# Patient Record
Sex: Female | Born: 1955 | Race: White | Hispanic: No | Marital: Married | Smoking: Former smoker
Health system: Southern US, Community
[De-identification: ages and names within clinical notes are randomized; demographics above are authoritative.]

## PROBLEM LIST (undated history)

## (undated) DIAGNOSIS — R002 Palpitations: Secondary | ICD-10-CM

## (undated) DIAGNOSIS — C801 Malignant (primary) neoplasm, unspecified: Secondary | ICD-10-CM

## (undated) DIAGNOSIS — Z720 Tobacco use: Secondary | ICD-10-CM

## (undated) HISTORY — DX: Tobacco use: Z72.0

## (undated) HISTORY — PX: TONSILLECTOMY: SUR1361

## (undated) HISTORY — DX: Palpitations: R00.2

## (undated) HISTORY — PX: OTHER SURGICAL HISTORY: SHX169

---

## 1984-05-06 HISTORY — PX: NEPHRECTOMY: SHX65

## 1989-01-04 HISTORY — PX: OOPHORECTOMY: SHX86

## 1998-12-11 ENCOUNTER — Ambulatory Visit (HOSPITAL_COMMUNITY): Admission: RE | Admit: 1998-12-11 | Discharge: 1998-12-11 | Payer: Self-pay | Admitting: Gynecology

## 1998-12-11 ENCOUNTER — Encounter: Payer: Self-pay | Admitting: Gynecology

## 1999-03-21 ENCOUNTER — Other Ambulatory Visit: Admission: RE | Admit: 1999-03-21 | Discharge: 1999-03-21 | Payer: Self-pay | Admitting: Gynecology

## 2000-05-13 ENCOUNTER — Other Ambulatory Visit: Admission: RE | Admit: 2000-05-13 | Discharge: 2000-05-13 | Payer: Self-pay | Admitting: Gynecology

## 2000-06-12 ENCOUNTER — Ambulatory Visit (HOSPITAL_COMMUNITY): Admission: RE | Admit: 2000-06-12 | Discharge: 2000-06-12 | Payer: Self-pay | Admitting: Gynecology

## 2000-06-12 ENCOUNTER — Encounter: Payer: Self-pay | Admitting: Gynecology

## 2001-07-04 ENCOUNTER — Encounter: Payer: Self-pay | Admitting: Family Medicine

## 2001-07-04 LAB — CONVERTED CEMR LAB

## 2001-08-03 ENCOUNTER — Other Ambulatory Visit: Admission: RE | Admit: 2001-08-03 | Discharge: 2001-08-03 | Payer: Self-pay | Admitting: Gynecology

## 2007-02-12 ENCOUNTER — Ambulatory Visit: Payer: Self-pay | Admitting: Family Medicine

## 2007-02-12 DIAGNOSIS — L2089 Other atopic dermatitis: Secondary | ICD-10-CM

## 2007-02-12 DIAGNOSIS — J309 Allergic rhinitis, unspecified: Secondary | ICD-10-CM | POA: Insufficient documentation

## 2007-03-31 ENCOUNTER — Ambulatory Visit: Payer: Self-pay | Admitting: Family Medicine

## 2007-03-31 DIAGNOSIS — H9209 Otalgia, unspecified ear: Secondary | ICD-10-CM | POA: Insufficient documentation

## 2007-03-31 DIAGNOSIS — B009 Herpesviral infection, unspecified: Secondary | ICD-10-CM | POA: Insufficient documentation

## 2007-07-13 ENCOUNTER — Telehealth: Payer: Self-pay | Admitting: Family Medicine

## 2007-10-06 ENCOUNTER — Ambulatory Visit: Payer: Self-pay | Admitting: Family Medicine

## 2007-10-06 DIAGNOSIS — Z87891 Personal history of nicotine dependence: Secondary | ICD-10-CM

## 2007-10-06 DIAGNOSIS — J019 Acute sinusitis, unspecified: Secondary | ICD-10-CM

## 2007-10-07 ENCOUNTER — Encounter: Payer: Self-pay | Admitting: Family Medicine

## 2007-10-20 ENCOUNTER — Ambulatory Visit: Payer: Self-pay | Admitting: Family Medicine

## 2007-10-20 DIAGNOSIS — N951 Menopausal and female climacteric states: Secondary | ICD-10-CM

## 2007-10-22 LAB — CONVERTED CEMR LAB
ALT: 22 units/L (ref 0–35)
AST: 22 units/L (ref 0–37)
Albumin: 4.4 g/dL (ref 3.5–5.2)
Alkaline Phosphatase: 32 units/L — ABNORMAL LOW (ref 39–117)
BUN: 15 mg/dL (ref 6–23)
Basophils Absolute: 0.1 10*3/uL (ref 0.0–0.1)
Basophils Relative: 1 % (ref 0.0–1.0)
Bilirubin, Direct: 0.1 mg/dL (ref 0.0–0.3)
CO2: 30 meq/L (ref 19–32)
Calcium: 9.6 mg/dL (ref 8.4–10.5)
Chloride: 105 meq/L (ref 96–112)
Cholesterol: 193 mg/dL (ref 0–200)
Creatinine, Ser: 1 mg/dL (ref 0.4–1.2)
Eosinophils Absolute: 0.1 10*3/uL (ref 0.0–0.7)
Eosinophils Relative: 1.8 % (ref 0.0–5.0)
GFR calc Af Amer: 74 mL/min
GFR calc non Af Amer: 61 mL/min
Glucose, Bld: 75 mg/dL (ref 70–99)
HCT: 38.3 % (ref 36.0–46.0)
HDL: 46.3 mg/dL (ref >39.0)
Hemoglobin: 13.4 g/dL (ref 12.0–15.0)
LDL Cholesterol: 128 mg/dL — ABNORMAL HIGH (ref 0–99)
Lymphocytes Relative: 34.1 % (ref 12.0–46.0)
MCHC: 34.9 g/dL (ref 30.0–36.0)
MCV: 91.3 fL (ref 78.0–100.0)
Monocytes Absolute: 0.2 10*3/uL (ref 0.1–1.0)
Monocytes Relative: 3.9 % (ref 3.0–12.0)
Neutro Abs: 3.2 10*3/uL (ref 1.4–7.7)
Neutrophils Relative %: 59.2 % (ref 43.0–77.0)
Platelets: 237 10*3/uL (ref 150–400)
Potassium: 4.2 meq/L (ref 3.5–5.1)
RBC: 4.19 M/uL (ref 3.87–5.11)
RDW: 11.5 % (ref 11.5–14.6)
Sodium: 141 meq/L (ref 135–145)
TSH: 1.41 microintl units/mL (ref 0.35–5.50)
Total Bilirubin: 0.7 mg/dL (ref 0.3–1.2)
Total CHOL/HDL Ratio: 4.2
Total Protein: 7.2 g/dL (ref 6.0–8.3)
Triglycerides: 92 mg/dL (ref 0–149)
VLDL: 18 mg/dL (ref 0–40)
WBC: 5.4 10*3/uL (ref 4.5–10.5)

## 2007-11-13 ENCOUNTER — Encounter: Payer: Self-pay | Admitting: Family Medicine

## 2007-11-23 ENCOUNTER — Encounter: Payer: Self-pay | Admitting: Family Medicine

## 2008-03-25 ENCOUNTER — Telehealth: Payer: Self-pay | Admitting: Family Medicine

## 2008-10-07 ENCOUNTER — Ambulatory Visit: Payer: Self-pay | Admitting: Family Medicine

## 2008-10-07 DIAGNOSIS — M545 Low back pain: Secondary | ICD-10-CM

## 2008-10-07 LAB — CONVERTED CEMR LAB
Bacteria, UA: 0
Bilirubin Urine: NEGATIVE
Blood in Urine, dipstick: NEGATIVE
Glucose, Urine, Semiquant: NEGATIVE
Ketones, urine, test strip: NEGATIVE
Nitrite: NEGATIVE
Protein, U semiquant: NEGATIVE
RBC / HPF: 0
Specific Gravity, Urine: 1.02
Urobilinogen, UA: 0.2
WBC Urine, dipstick: NEGATIVE
WBC, UA: 0 cells/hpf
pH: 5

## 2008-12-28 ENCOUNTER — Telehealth: Payer: Self-pay | Admitting: Family Medicine

## 2010-01-11 ENCOUNTER — Telehealth: Payer: Self-pay | Admitting: Family Medicine

## 2010-06-05 ENCOUNTER — Ambulatory Visit
Admission: RE | Admit: 2010-06-05 | Discharge: 2010-06-05 | Payer: Self-pay | Source: Home / Self Care | Attending: Family Medicine | Admitting: Family Medicine

## 2010-06-05 ENCOUNTER — Other Ambulatory Visit: Payer: Self-pay | Admitting: Family Medicine

## 2010-06-05 ENCOUNTER — Ambulatory Visit: Admit: 2010-06-05 | Payer: Self-pay | Admitting: Family Medicine

## 2010-06-05 DIAGNOSIS — M79609 Pain in unspecified limb: Secondary | ICD-10-CM

## 2010-06-05 LAB — CBC WITH DIFFERENTIAL/PLATELET
Basophils Absolute: 0 10*3/uL (ref 0.0–0.1)
Basophils Relative: 0.7 % (ref 0.0–3.0)
Eosinophils Absolute: 0 10*3/uL (ref 0.0–0.7)
Eosinophils Relative: 1 % (ref 0.0–5.0)
HCT: 40.4 % (ref 36.0–46.0)
Hemoglobin: 14.1 g/dL (ref 12.0–15.0)
Lymphocytes Relative: 32.9 % (ref 12.0–46.0)
Lymphs Abs: 1.5 10*3/uL (ref 0.7–4.0)
MCHC: 35 g/dL (ref 30.0–36.0)
MCV: 90.9 fl (ref 78.0–100.0)
Monocytes Absolute: 0.2 10*3/uL (ref 0.1–1.0)
Monocytes Relative: 4.4 % (ref 3.0–12.0)
Neutro Abs: 2.8 10*3/uL (ref 1.4–7.7)
Neutrophils Relative %: 61 % (ref 43.0–77.0)
Platelets: 241 10*3/uL (ref 150.0–400.0)
RBC: 4.44 Mil/uL (ref 3.87–5.11)
RDW: 12.9 % (ref 11.5–14.6)
WBC: 4.5 10*3/uL (ref 4.5–10.5)

## 2010-06-05 LAB — URIC ACID: Uric Acid, Serum: 6.5 mg/dL (ref 2.4–7.0)

## 2010-06-05 IMAGING — CR DG HAND COMPLETE 3+V*R*
3 series · 3 of 3 positions shown · non-contrast
Comparison: None.

CLINICAL DATA: Right thumb pain for 1 week.

RIGHT HAND - COMPLETE 3+ VIEW

[view not recorded (1 of 3)]
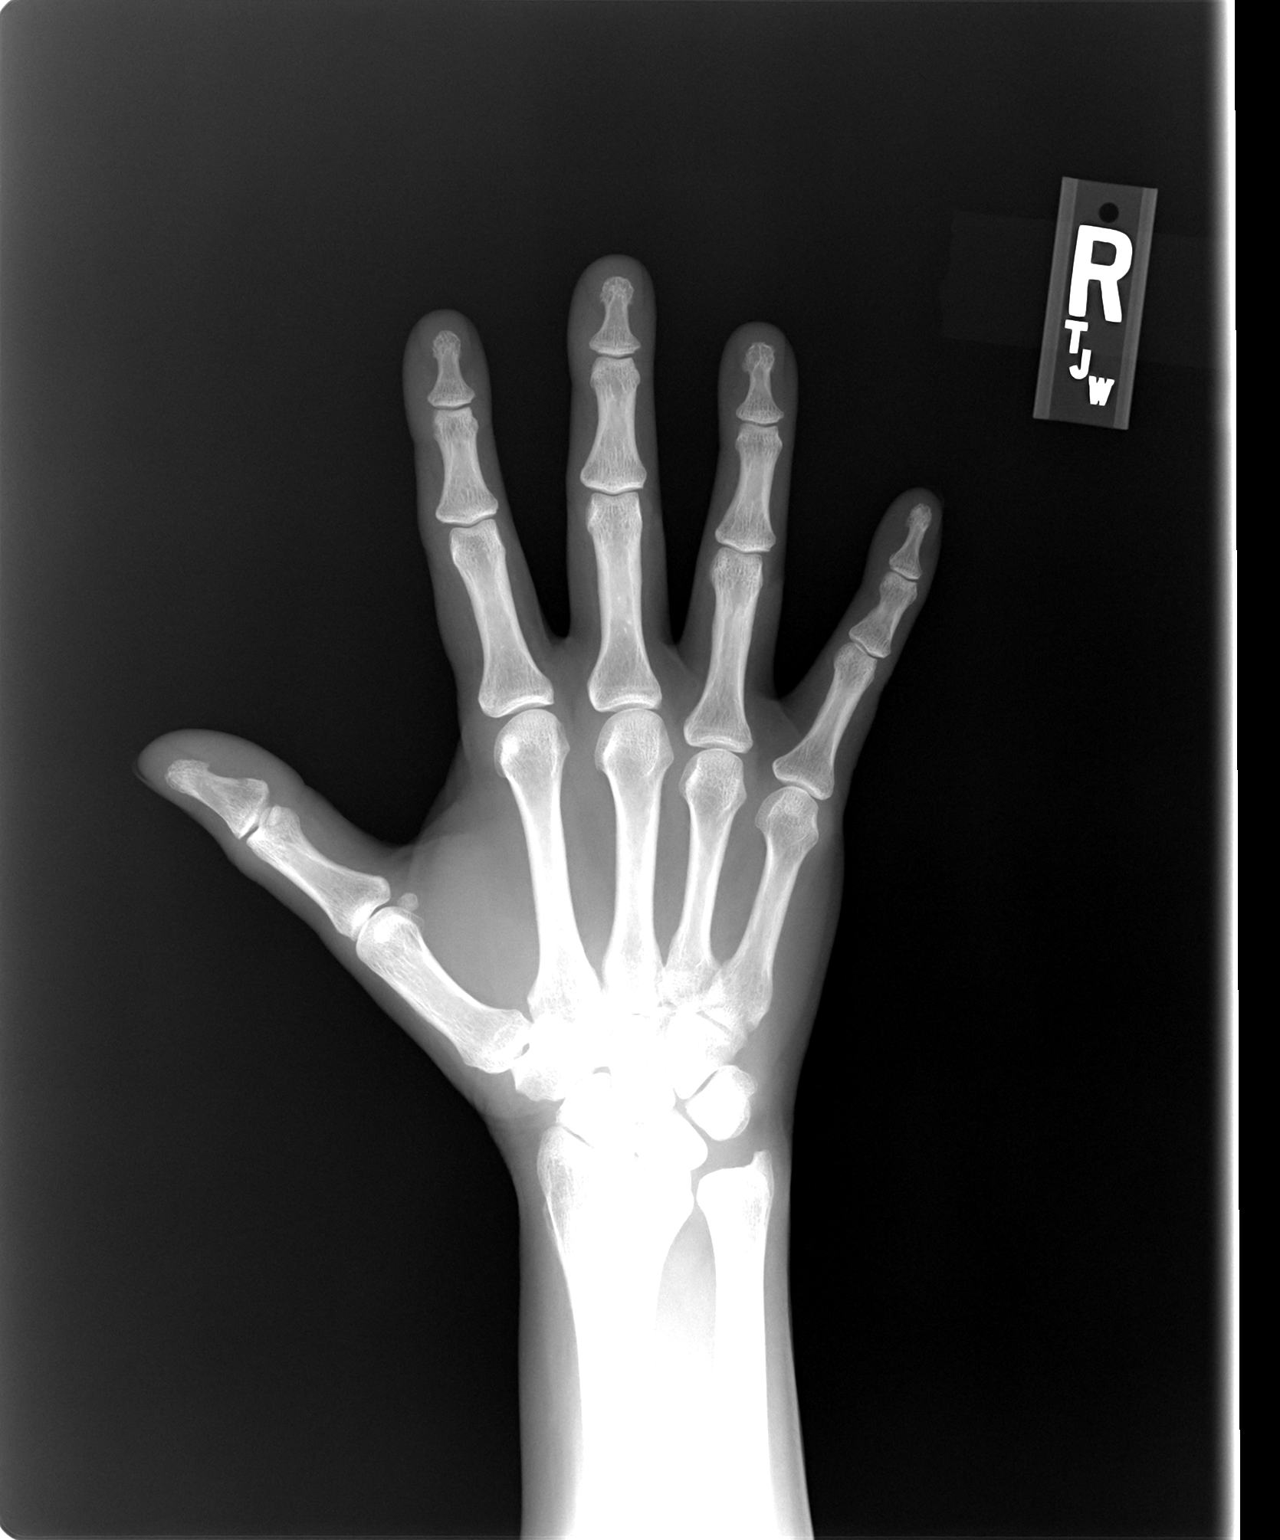

[view not recorded (2 of 3)]
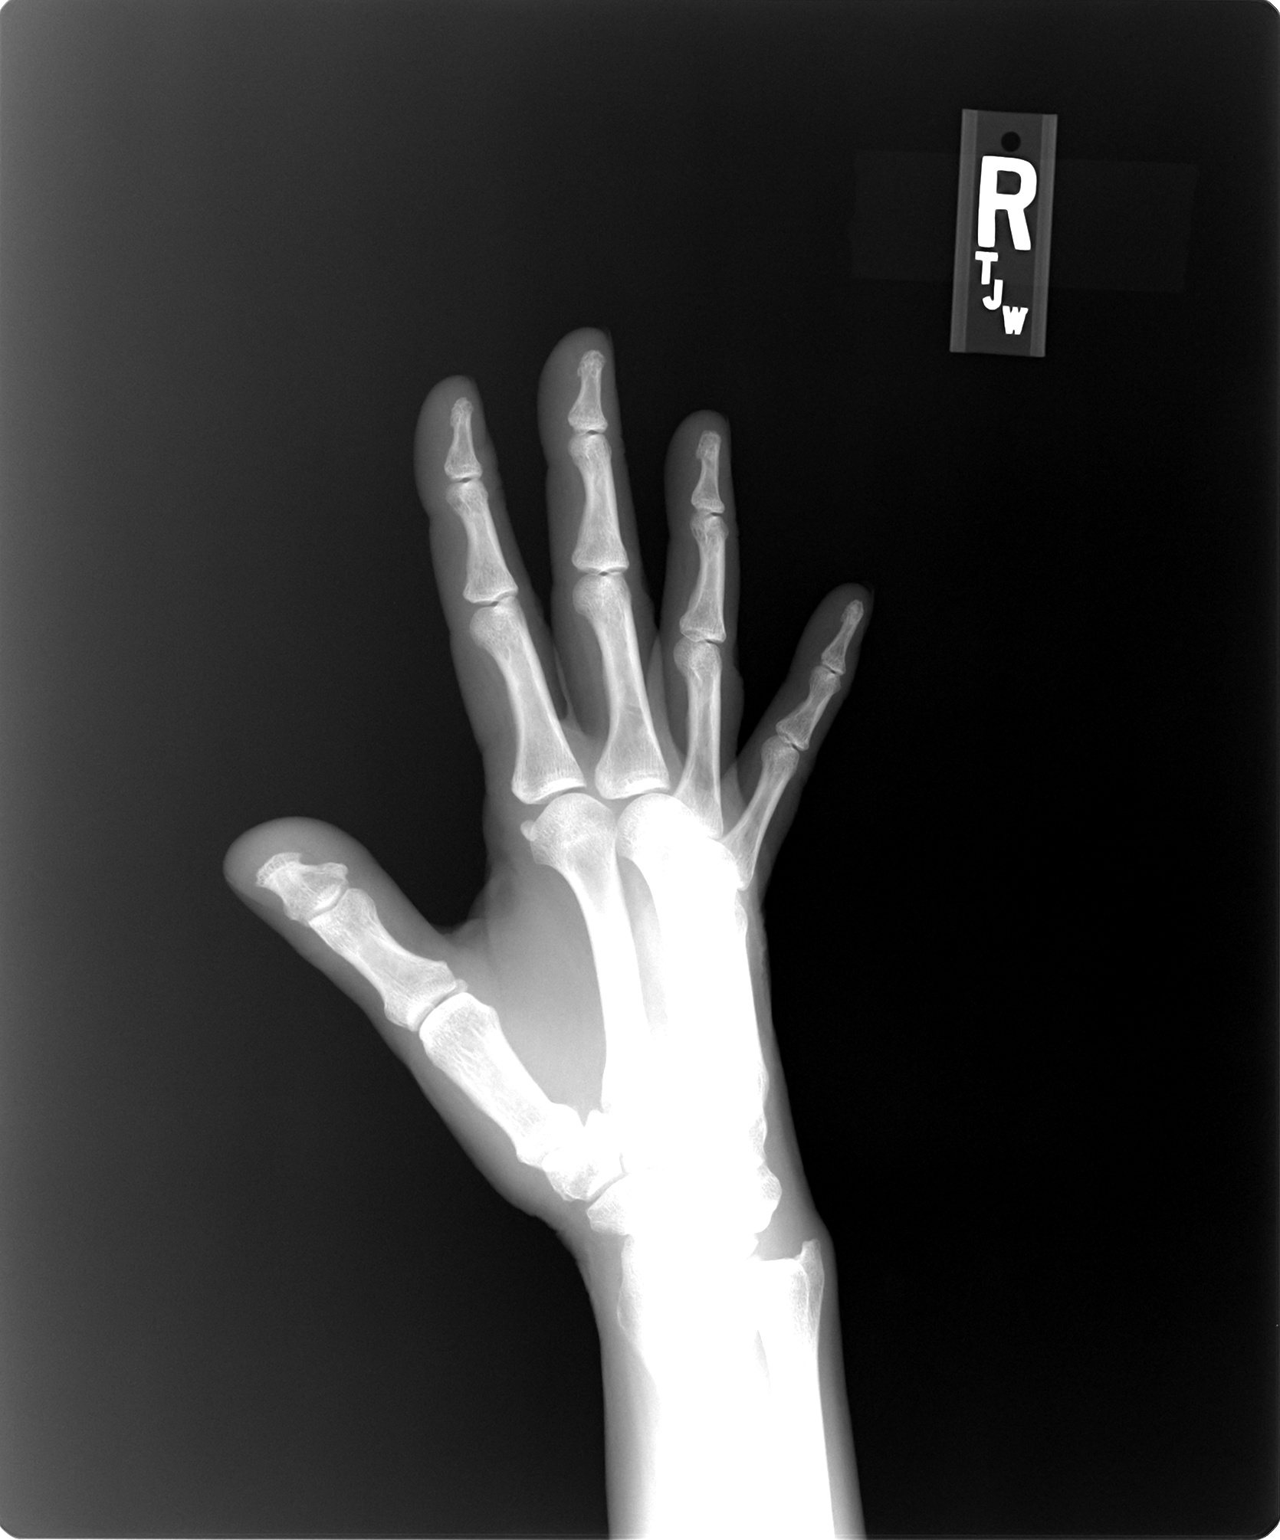

[view not recorded (3 of 3)]
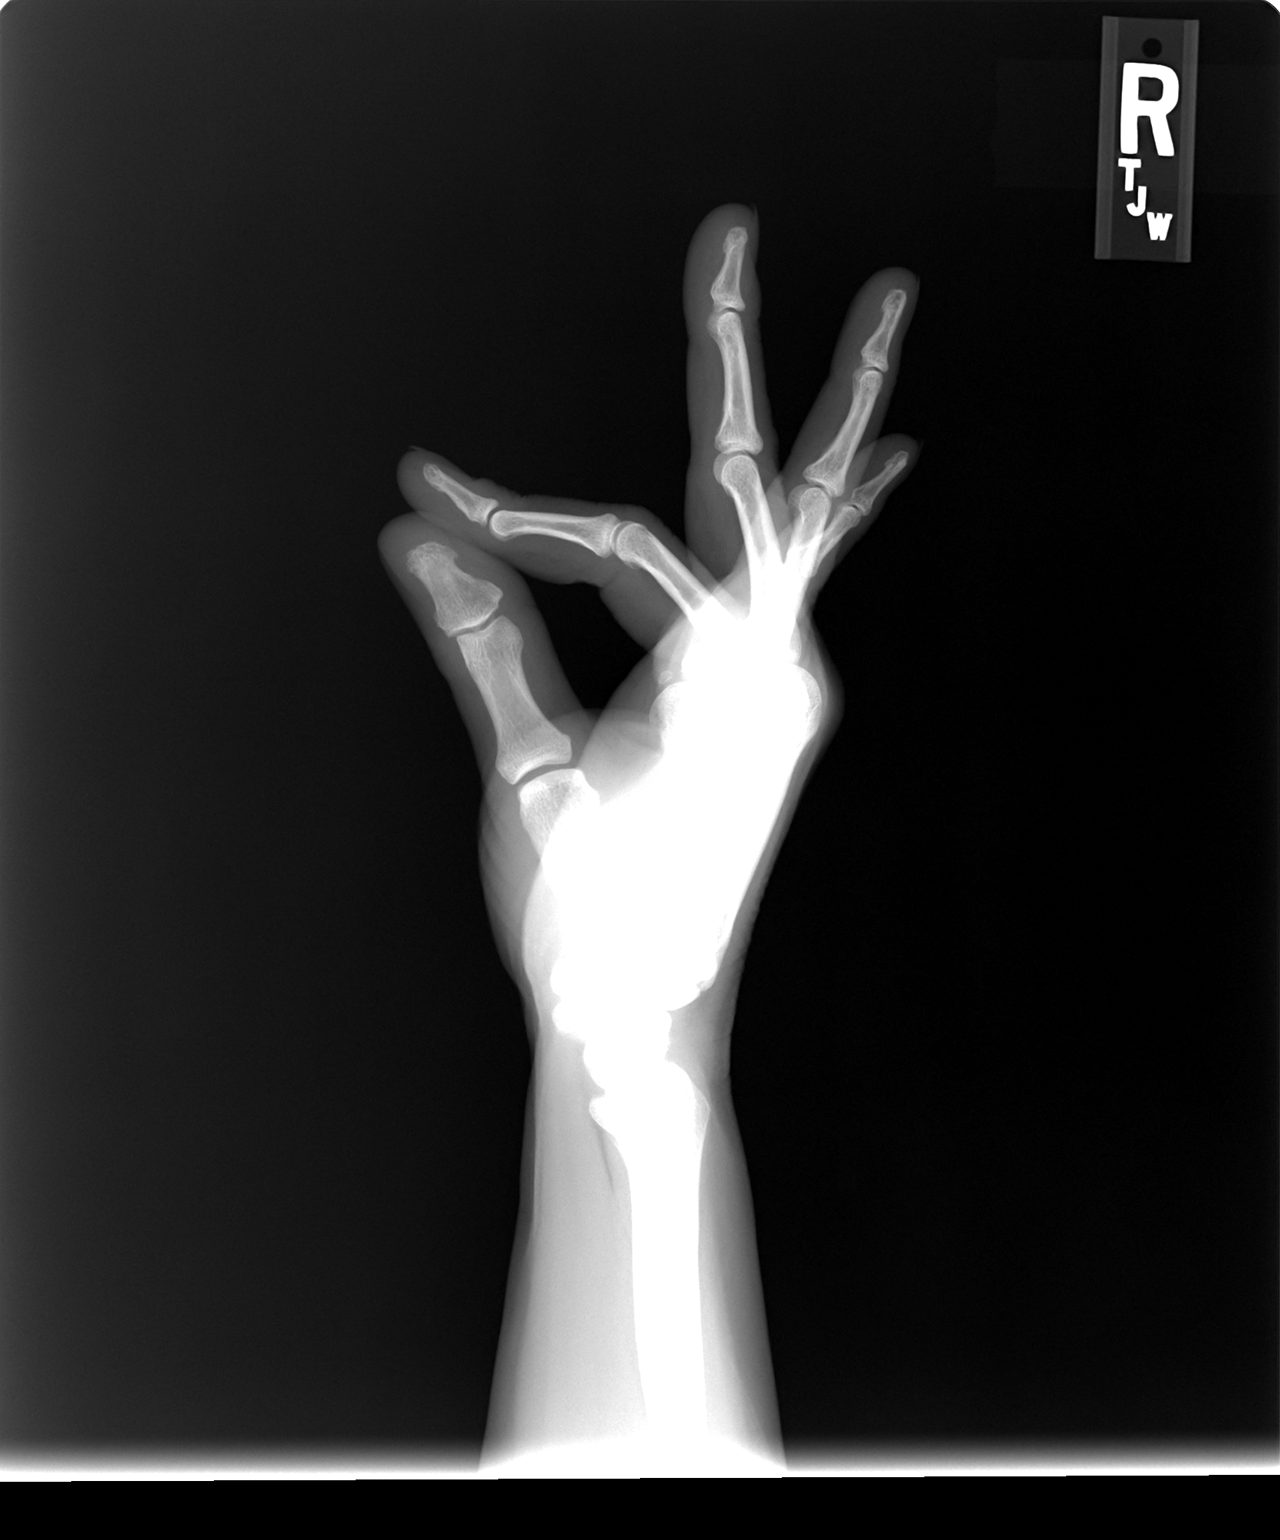

[3 of 3 positions shown; findings below may reference images not displayed]

FINDINGS: The mineralization and alignment are normal.  There is no
evidence of acute fracture or dislocation.  No soft tissue
abnormalities are identified.  The joint spaces appear preserved.
IMPRESSION: No acute osseous findings.

## 2010-06-07 NOTE — Progress Notes (Signed)
Summary: Triamcinolone 0.5% cream  Phone Note Refill Request Call back at [phone] Message from:  [facility] [address] on January 11, 2010 4:37 PM  Refills Requested: Medication #1:  TRIAMCINOLONE ACETONIDE 0.5 %  CREA apply once daily or as directed [facility] electronically request refill for Triamcinolone acetonide 0.5% cream. Dr Milinda Antis gave refill on 12/28/08 #15 gm with 3 refills. Last refill not sent by pharmacy. Please advise.    Method Requested: Electronic Initial call taken by: Lewanda Rife LPN,  January 11, 2010 4:38 PM  Follow-up for Phone Call        px written on EMR for call in  Follow-up by: Judith Part MD,  January 11, 2010 9:06 PM  Additional Follow-up for Phone Call Additional follow up Details #1::        Medication phoned [facility]  pharm[facility] as instructed. Lewanda Rife LPN  January 12, 2010 11:30 AM     Prescriptions: TRIAMCINOLONE ACETONIDE 0.5 %  CREA (TRIAMCINOLONE ACETONIDE) apply once daily or as directed  #15g x 3   Entered and Authorized by:   Judith Part MD   Signed by:   Judith Part MD on 01/11/2010   Method used:   Telephoned to ...         RxID:   [id number]

## 2010-06-13 NOTE — Assessment & Plan Note (Signed)
Summary: SWOLLEN THUMB, THROBBING WITH PRESSURE/JRT   Vital Signs:  Patient profile:   54 year old female Height:      60.25 inches Weight:      138.75 pounds BMI:     26.97 Temp:     98.0 degrees F oral Pulse rate:   75 / minute Pulse rhythm:   regular BP sitting:   120 / 80  (right arm) Cuff size:   regular  Vitals Entered By: Linde Gillis CMA Duncan Dull) (June 05, 2010 12:04 PM) CC: swollen thumb   History of Present Illness: 55 yo with acute onset of right thumb swelling, redness and pain a few days ago. Never had anything like she before.   Was extremely tender, a little red and warm when it started. Naproxen is helping some with the pain. NO h/o OA or Gout. No trauma to thumb or hand. No other joints involved.  PMH significant for nephrectomy.  Current Medications (verified): 1)  Claritin 10 Mg Tabs (Loratadine) .... Take One Tablet By Mouth Daily 2)  Hydrocodone-Acetaminophen 5-325 Mg Tabs (Hydrocodone-Acetaminophen) .Marland Kitchen.. 1 By Mouth Up To 4 Times Per Day As Needed For Pain  Allergies: 1)  * Xray Dye  Past History:  Past Medical History: Last updated: 10/06/2007 Allergic rhinitis tab abuse   Past Surgical History: Last updated: 02/12/2007 Hysterectomy- endometriosis Nephrectomy (1986) Oophorectomy (1990's) Tonsillectomy Dexa- normal (09/2001)  Family History: Last updated: 10/20/2007 Father: CAD- stents, HTN, DM, died of lung cancer , ETOH Mother: CAD-PTCA, HTN Siblings:  M uncle CAD at 67 M aunt with CAD at 53 son with aortic valve replacement   Social History: Last updated: 10/20/2007 Married Current Smoker 4 kids   Risk Factors: Smoking Status: current (02/12/2007)  Review of Systems      See HPI General:  Denies fever and malaise. MS:  Complains of joint pain, joint redness, and joint swelling; denies loss of strength.  Physical Exam  General:  alert, well-developed, well-nourished, and well-hydrated.  NAD Msk:  Right thumb DIP  swollen, red, TTP, normal ROM, FROM of right hand.  No other swollen joints. Psych:  normally interactive and good eye contact.     Impression & Recommendations:  Problem # 1:  THUMB PAIN, RIGHT (ICD-729.5) Assessment New Differential includes OA, gouty arthritis, less likely other infectious arthritis. Will get hand xray as well as CBC and uric acid. Advised using NSAIDs with caution given that she is s/p nephrectomy.  Given Rx for percocet to use for severe pain. Orders: T-Hand Right 3 views (73130TC) Venipuncture (82956) TLB-CBC Platelet - w/Differential (85025-CBCD) TLB-Uric Acid, Blood (84550-URIC)  Complete Medication List: 1)  Claritin 10 Mg Tabs (Loratadine) .... Take one tablet by mouth daily 2)  Hydrocodone-acetaminophen 5-325 Mg Tabs (Hydrocodone-acetaminophen) .Marland Kitchen.. 1 by mouth up to 4 times per day as needed for pain Prescriptions: HYDROCODONE-ACETAMINOPHEN 5-325 MG TABS (HYDROCODONE-ACETAMINOPHEN) 1 by mouth up to 4 times per day as needed for pain  #30 x 0   Entered and Authorized by:   Ruthe Mannan MD   Signed by:   Ruthe Mannan MD on 06/05/2010   Method used:   Print then Give to Patient   RxID:   [id number]    Orders Added: 1)  T-Hand Right 3 views [73130TC] 2)  Venipuncture [28413] 3)  TLB-CBC Platelet - w/Differential [85025-CBCD] 4)  TLB-Uric Acid, Blood [84550-URIC] 5)  Est. Patient Level IV [24401]    Current Allergies (reviewed today): * XRAY DYE

## 2010-07-25 ENCOUNTER — Telehealth: Payer: Self-pay | Admitting: *Deleted

## 2010-07-25 MED ORDER — CYCLOBENZAPRINE HCL 10 MG PO TABS: 10.0000 mg | ORAL_TABLET | Freq: Three times a day (TID) | ORAL | Status: AC | PRN

## 2010-07-25 NOTE — Telephone Encounter (Signed)
She has had intermittent back strain in the past  We can try flexeril short term - but if not imp f/u Also watch out for sedation  Px put on med list for call in

## 2010-07-25 NOTE — Telephone Encounter (Signed)
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 She says that her back has been causing her trouble and she is asking if she can get a muscle relaxer called in [facility] on [address].

## 2010-07-25 NOTE — Telephone Encounter (Signed)
Patient notified as instructed by telephone. Med called to [facility] as instructed.

## 2010-09-18 ENCOUNTER — Other Ambulatory Visit: Payer: Self-pay | Admitting: *Deleted

## 2010-09-18 MED ORDER — VALACYCLOVIR HCL 500 MG PO TABS: ORAL_TABLET | ORAL | Status: DC

## 2010-09-18 NOTE — Telephone Encounter (Signed)
Please check quantity and refills.

## 2010-09-18 NOTE — Telephone Encounter (Signed)
Px written for call in   

## 2010-09-18 NOTE — Telephone Encounter (Signed)
[facility]. 

## 2010-12-28 ENCOUNTER — Ambulatory Visit (INDEPENDENT_AMBULATORY_CARE_PROVIDER_SITE_OTHER): Payer: 59 | Admitting: Family Medicine

## 2010-12-28 ENCOUNTER — Encounter: Payer: Self-pay | Admitting: Family Medicine

## 2010-12-28 ENCOUNTER — Telehealth: Payer: Self-pay

## 2010-12-28 VITALS — BP 140/80 | HR 68 | Temp 97.8°F | Wt 135.0 lb

## 2010-12-28 DIAGNOSIS — R0789 Other chest pain: Secondary | ICD-10-CM

## 2010-12-28 DIAGNOSIS — R0602 Shortness of breath: Secondary | ICD-10-CM

## 2010-12-28 LAB — CK TOTAL AND CKMB (NOT AT ARMC): Relative Index: 3 — ABNORMAL HIGH (ref 0.0–2.5)

## 2010-12-28 LAB — D-DIMER, QUANTITATIVE: D-Dimer, Quant: 0.31 ug/mL-FEU (ref 0.00–0.48)

## 2010-12-28 MED ORDER — ASPIRIN EC 81 MG PO TBEC: 81.0000 mg | DELAYED_RELEASE_TABLET | Freq: Every day | ORAL | Status: AC

## 2010-12-28 NOTE — Telephone Encounter (Signed)
Pt having shortness of breath and fast heart beat (worse with exertion) for 2 days. Dr Milinda Antis recommended pt go to ER incase needed studies such as CT or other testing that would need to be done at hospital. Pt said she hates ER because of the waiting and she wants appt hear. I told Dr Milinda Antis and she said OK if that is what pt wanted to do she can come here and be seen. Pt has appt with Dr Sharen Hones today at 3:15pm.

## 2010-12-28 NOTE — Progress Notes (Signed)
Subjective:    Patient ID: Danielle Solis, female    DOB: Nov 11, 1955, 55 y.o.   MRN: [id number]  HPI CC: SOB, palpitations  2 wk h/o SOB, progressively getting more noticeable.  Last time this happened was today at work - racing heart, short of breath as well as some chest tightness substernally.  Sometimes notes head rush and dizziness with this.  Does occur after exhertion.  Relieved with rest.  Does feel like skipped beats at times.  Currently with mild SOB.  Some mild nausea when gets these episodes as well as diaphoresis.  Episodes tend to last 10-15 min.  No fevers/chills, coughing.  No leg pains.  Walks 4 miles every day, has noticed this when walking.  No h/o anxiety.  No asthma, COPD.  Quit smoking ~2 years ago, ~30 PY hx.  No h/o HTN or HLD.  BP usually on lower end.  Today a bit higher than norm for her.  Strong fmhx early CAD. Great aunt with h/o blood clot.  No personal hx, no recent immobility, no hormonal meds currently.    Lab Results  Component Value Date   CHOL 193 10/20/2007   Lab Results  Component Value Date   HDL 46.3 10/20/2007   Lab Results  Component Value Date   LDLCALC 128* 10/20/2007   Lab Results  Component Value Date   TRIG 92 10/20/2007   Lab Results  Component Value Date   CHOLHDL 4.2 CALC 10/20/2007   Medications and allergies reviewed and updated in chart.  Past histories reviewed and updated if relevant as below. Patient Active Problem List  Diagnoses  . FEVER BLISTER  . TOBACCO USE  . EAR PAIN, LEFT  . SINUSITIS- ACUTE-NOS  . ALLERGIC RHINITIS  . POSTMENOPAUSAL STATUS  . DERMATITIS, ATOPIC  . BACK PAIN, LUMBAR  . THUMB PAIN, RIGHT   Past Medical History  Diagnosis Date  . Allergic rhinitis   . Tobacco abuse    Past Surgical History  Procedure Date  . Gyn surgery     hysterectomy for endometriosis  . Nephrectomy 1986  . Oophorectomy 1990's  . Tonsillectomy    History  Substance Use Topics  . Smoking status: Former  Games developer  . Smokeless tobacco: Not on file  . Alcohol Use: Not on file   Family History  Problem Relation Age of Onset  . Hypertension Mother   . Coronary artery disease Mother     PTCA  . Coronary artery disease Father     stents  . Hypertension Father   . Alcohol abuse Father   . Cancer Father     lung  . Coronary artery disease Son     aortic valve replacement  . Coronary artery disease Maternal Aunt 40  . Coronary artery disease Maternal Uncle 40   Allergies  Allergen Reactions  . Contrast Media (Iodinated Diagnostic Agents)    Current Outpatient Prescriptions on File Prior to Visit  Medication Sig Dispense Refill  . valACYclovir (VALTREX) 500 MG tablet Take one tablet twice a day for 5 days as needed for cold sore  10 tablet  1  . cyclobenzaprine (FLEXERIL) 10 MG tablet Take 1 tablet (10 mg total) by mouth every 8 (eight) hours as needed for muscle spasms (warn of sedation).  30 tablet  0   Review of Systems Per HPI    Objective:   Physical Exam  Nursing note and vitals reviewed. Constitutional: She appears well-developed and well-nourished. No distress.  HENT:  Head: Normocephalic and atraumatic.  Mouth/Throat: Oropharynx is clear and moist. No oropharyngeal exudate.  Eyes: Conjunctivae and EOM are normal. Pupils are equal, round, and reactive to light. No scleral icterus.  Neck: Normal range of motion. Neck supple. No thyromegaly present.  Cardiovascular: Normal rate, regular rhythm, normal heart sounds and intact distal pulses.   No murmur heard. Pulmonary/Chest: Effort normal and breath sounds normal. No respiratory distress. She has no wheezes. She has no rales.  Abdominal: Soft. Bowel sounds are normal. She exhibits no distension. There is no tenderness. There is no rebound and no guarding.  Musculoskeletal: She exhibits no edema.  Skin: Skin is warm and dry. No rash noted.  Psychiatric: Her mood appears anxious (slight).          Assessment & Plan:

## 2010-12-28 NOTE — Assessment & Plan Note (Addendum)
With palpitations and chest tightness. Last LDL 128.  Strong family history of CAD/MIs at early age.  Ex smoker, quit ~2 yrs ago.  No h/o HTN or other risk factors. Currently minimal sob. EKG today - NSR at rate of 65, no arrhythmia, nl axis, intervals, no acute ST/T changes, reassuring. Discussed safest place to be is ER for continual monitoring currently, pt understands we cannot say for certain not cardiac source currently, but pt does not want to go to ER.  sxs have been going on 2 wks.   Will draw stat blood work to eval for clot and cardiac cause, if positive send to ER.  If neg, set up with cards for next week for further eval of chest tightness and SOB.  In meantime start baby aspirin.  Will call pt with blood work results tonight. Red flags to go to ER discussed.

## 2010-12-28 NOTE — Patient Instructions (Addendum)
EKG looking normal today. I'd like to get blood work today to evaluate heart and will call you at [phone] with results.  If abnormal, will recommend you go to ER for further evaluation. Will set you up with heart doctor for next week for further evaluation.  Pass by Marion's office to set this up. In meantime, start taking baby aspirin (81mg  daily). If worsening shortness of breath, chest pressure, or dizziness, seek urgent care.

## 2010-12-31 ENCOUNTER — Encounter: Payer: Self-pay | Admitting: Cardiovascular Disease

## 2010-12-31 ENCOUNTER — Ambulatory Visit (INDEPENDENT_AMBULATORY_CARE_PROVIDER_SITE_OTHER): Payer: 59 | Admitting: Cardiovascular Disease

## 2010-12-31 ENCOUNTER — Encounter: Payer: Self-pay | Admitting: *Deleted

## 2010-12-31 VITALS — BP 130/83 | HR 69 | Ht 62.0 in | Wt 133.0 lb

## 2010-12-31 DIAGNOSIS — R0609 Other forms of dyspnea: Secondary | ICD-10-CM

## 2010-12-31 DIAGNOSIS — R0602 Shortness of breath: Secondary | ICD-10-CM

## 2010-12-31 DIAGNOSIS — R0600 Dyspnea, unspecified: Secondary | ICD-10-CM

## 2010-12-31 DIAGNOSIS — R0989 Other specified symptoms and signs involving the circulatory and respiratory systems: Secondary | ICD-10-CM

## 2010-12-31 NOTE — Progress Notes (Signed)
Danielle Solis Date of Birth  04-08-1956 [facility] [address]     [address] [place], [place]  [address] [phone]  Fax  [phone]  History of Present Illness:  55 year old female who is relatively healthy. She recently presented with some shortness of breath while weeding in her garden.  She walks 4 miles a day on a regular basis and has recently noted that she short of breath during her exercise.  Chest the chest tightness especially with exertion.  The symptoms resolve if she slows down or stops walking.  She denies any syncope or presyncope. She denies any orthopnea or PND. She denies any cough or fever.  Current Outpatient Prescriptions on File Prior to Visit  Medication Sig Dispense Refill  . aspirin EC 81 MG tablet Take 1 tablet (81 mg total) by mouth daily.      . cyclobenzaprine (FLEXERIL) 10 MG tablet Take 1 tablet (10 mg total) by mouth every 8 (eight) hours as needed for muscle spasms (warn of sedation).  30 tablet  0  . loratadine (CLARITIN) 10 MG tablet Take 10 mg by mouth daily.        . valACYclovir (VALTREX) 500 MG tablet Take one tablet twice a day for 5 days as needed for cold sore  10 tablet  1    Allergies  Allergen Reactions  . Contrast Media (Iodinated Diagnostic Agents)     Past Medical History  Diagnosis Date  . Allergic rhinitis   . Tobacco abuse     Past Surgical History  Procedure Date  . Gyn surgery     hysterectomy for endometriosis  . Nephrectomy 1986  . Oophorectomy 1990's  . Tonsillectomy     History  Smoking status  . Former Smoker -- 40 years  . Types: Cigarettes  . Quit date: 12/30/2008  Smokeless tobacco  . Not on file    History  Alcohol Use  . 0.5 oz/week  . 1 drink(s) per week    Family History  Problem Relation Age of Onset  . Hypertension Mother   . Coronary artery disease Mother     PTCA  . Heart disease Mother   . Coronary artery disease Father     stents  .  Hypertension Father   . Alcohol abuse Father   . Cancer Father     lung  . Aortic stenosis Son     aortic valve replacement and root replacement for bicuspid aortic valve  . Coronary artery disease Maternal Aunt 40  . Coronary artery disease Maternal Uncle 40     Reviw of Systems:  Reviewed in the HPI.  All other systems are negative.  Physical Exam: BP 130/83  Pulse 69  Ht 5\' 2"  (1.575 m)  Wt 133 lb (60.328 kg)  BMI 24.33 kg/m2 The patient is alert and oriented x 3.  The mood and affect are normal.   Skin: warm and dry.  Color is normal.    HEENT:   the sclera are nonicteric.  The mucous membranes are moist.  The carotids are 2+ without bruits.  There is no thyromegaly.  There is no JVD.    Lungs: clear.  The chest wall is non tender.    Heart: regular rate with a normal S1 and S2.  There are no murmurs, gallops, or rubs. The PMI is not displaced.     Abdomen: good bowel sounds.  There is no guarding or rebound.  There is  no hepatosplenomegaly or tenderness.  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   Extremities:  no clubbing, cyanosis, or edema.  The legs are without rashes.  The distal pulses are intact.   Neuro:  Cranial nerves II - XII are intact.  Motor and sensory functions are intact.    The gait is normal.  ECG: NSR.  Assessment / Plan:

## 2010-12-31 NOTE — Patient Instructions (Signed)
You are scheduled for a stress test Myoview and Echocardiogram in [place]. Please refer to your instruction sheet given today.  Your physician has requested that you have an echocardiogram. Echocardiography is a painless test that uses sound waves to create images of your heart. It provides your doctor with information about the size and shape of your heart and how well your heart's chambers and valves are working. This procedure takes approximately one hour. There are no restrictions for this procedure.  Your physician has requested that you have an abdominal aorta duplex. During this test, an ultrasound is used to evaluate the aorta. Allow 30 minutes for this exam. Do not eat after midnight the day before and avoid carbonated beverages WE WILL CALL YOU AT A LATER TIME TO SCHEDULE THIS APPT AT [facility].

## 2010-12-31 NOTE — Assessment & Plan Note (Signed)
I was able to palpate her abdominal aorta today. She has history of cigarette smoking. We'll get an ultrasound of her abdomen to rule out abdominal aortic aneurysm.

## 2010-12-31 NOTE — Assessment & Plan Note (Signed)
Danielle Solis presents with episodes of dyspnea for the past several weeks. She typically is very healthy and exercises right basis. She's noted shortness breath doing her normal activities and also with walking.  We'll get an echocardiogram for further evaluation of her valvular function and left ventricular function. We'll also get a stress Myoview study to rule out ischemia.

## 2011-01-08 ENCOUNTER — Ambulatory Visit (HOSPITAL_BASED_OUTPATIENT_CLINIC_OR_DEPARTMENT_OTHER): Payer: 59 | Admitting: Radiology

## 2011-01-08 ENCOUNTER — Ambulatory Visit (HOSPITAL_COMMUNITY): Payer: 59 | Attending: Cardiovascular Disease | Admitting: Radiology

## 2011-01-08 VITALS — Ht 62.0 in | Wt 132.0 lb

## 2011-01-08 DIAGNOSIS — Z8249 Family history of ischemic heart disease and other diseases of the circulatory system: Secondary | ICD-10-CM | POA: Insufficient documentation

## 2011-01-08 DIAGNOSIS — R0609 Other forms of dyspnea: Secondary | ICD-10-CM

## 2011-01-08 DIAGNOSIS — R42 Dizziness and giddiness: Secondary | ICD-10-CM | POA: Insufficient documentation

## 2011-01-08 DIAGNOSIS — R0600 Dyspnea, unspecified: Secondary | ICD-10-CM

## 2011-01-08 DIAGNOSIS — Z87891 Personal history of nicotine dependence: Secondary | ICD-10-CM | POA: Insufficient documentation

## 2011-01-08 DIAGNOSIS — R0789 Other chest pain: Secondary | ICD-10-CM

## 2011-01-08 DIAGNOSIS — R0989 Other specified symptoms and signs involving the circulatory and respiratory systems: Secondary | ICD-10-CM | POA: Insufficient documentation

## 2011-01-08 MED ORDER — REGADENOSON 0.4 MG/5ML IV SOLN
0.4000 mg | Freq: Once | INTRAVENOUS | Status: AC
  Administered 2011-01-08: 0.4 mg via INTRAVENOUS

## 2011-01-08 MED ORDER — TECHNETIUM TC 99M TETROFOSMIN IV KIT
11.0000 | PACK | Freq: Once | INTRAVENOUS | Status: AC | PRN
  Administered 2011-01-08: 11 via INTRAVENOUS

## 2011-01-08 MED ORDER — TECHNETIUM TC 99M TETROFOSMIN IV KIT
33.0000 | PACK | Freq: Once | INTRAVENOUS | Status: AC | PRN
  Administered 2011-01-08: 33 via INTRAVENOUS

## 2011-01-08 NOTE — Progress Notes (Signed)
[facility] SITE 3 NUCLEAR MED [address] [place] [address] [phone]  Cardiology Nuclear Med Study  TAYTEM GHATTAS is a 55 y.o. female [id number] 1956/03/11   Nuclear Med Background Indication for Stress Test:  Evaluation for Ischemia History:  No previous documented CAD; Palpable Abdominal Aorta on last OV (U/S to be done 01/11/11) Cardiac Risk Factors: Family History - CAD and History of Smoking  Symptoms:  Chest Tightness with and without Exertion (last episode of chest discomfort was today, 3-4/10, none now), Diaphoresis, Dizziness, DOE/SOB, Nausea, Near Syncope, Palpitations, Rapid HR    Nuclear Pre-Procedure Caffeine/Decaff Intake:  None NPO After: 7:00pm   Lungs:  Clear.  O2 Sat 100% on RA. IV 0.9% NS with Angio Cath:  20g  IV Site: R Antecubital  IV Started by:  Stanton Kidney, EMT-P  Chest Size (in):  34 Cup Size: B  Height: 5\' 2"  (1.575 m)  Weight:  132 lb (59.875 kg)  BMI:  Body mass index is 24.14 kg/(m^2). Tech Comments:  NA    Nuclear Med Study 1 or 2 day study: 1 day  Stress Test Type:  Lexiscan  Reading MD: Willa Rough, MD  Order Authorizing Provider:  Kristeen Miss, MD  Resting Radionuclide: Technetium 69m Tetrofosmin  Resting Radionuclide Dose: 11 mCi   Stress Radionuclide:  Technetium 52m Tetrofosmin  Stress Radionuclide Dose: 33 mCi           Stress Protocol Rest HR: 55 Stress HR: 117  Rest BP: 132/78 Stress BP: 153/77  Exercise Time (min): n/a METS: n/a   Predicted Max HR: 163 bpm % Max HR: 71.78 bpm Rate Pressure Product: 53664   Dose of Adenosine (mg):  n/a Dose of Lexiscan: 0.4 mg  Dose of Atropine (mg): n/a Dose of Dobutamine: n/a mcg/kg/min (at max HR)  Stress Test Technologist: Smiley Houseman, CMA-N  Nuclear Technologist:  Domenic Polite, CNMT     Rest Procedure:  Myocardial perfusion imaging was performed at rest 45 minutes following the intravenous administration of Technetium 48m Tetrofosmin.  Rest ECG: No  acute changes.  Stress Procedure:  The patient received IV Lexiscan 0.4 mg over 15-seconds.  Technetium 55m Tetrofosmin injected at 30-seconds.  There were no significant changes with Lexiscan.  Quantitative spect images were obtained after a 45 minute delay.  Stress ECG: No significant change from baseline ECG  QPS Raw Data Images:  Normal; no motion artifact; normal heart/lung ratio. Stress Images:  Normal homogeneous uptake in all areas of the myocardium. Rest Images:  Normal homogeneous uptake in all areas of the myocardium. Subtraction (SDS):  No evidence of ischemia. Transient Ischemic Dilatation (Normal <1.22):  1.10 Lung/Heart Ratio (Normal <0.45):  .41  Quantitative Gated Spect Images QGS EDV:  52 ml QGS ESV:  11 ml QGS cine images:  Normal Wall Motion QGS EF: 78%  Impression Exercise Capacity:  Lexiscan with no exercise. BP Response:  Normal blood pressure response. Clinical Symptoms:  Stomach tightness ECG Impression:  No significant ST segment change suggestive of ischemia. Comparison with Prior Nuclear Study: No previous nuclear study performed  Overall Impression:  Normal stress nuclear study.  Willa Rough

## 2011-01-10 ENCOUNTER — Other Ambulatory Visit: Payer: Self-pay | Admitting: Family Medicine

## 2011-01-10 NOTE — Telephone Encounter (Signed)
Spoke with pharmacist at [facility] and was sent by Dr Milinda Antis on 01/09/11.

## 2011-01-11 ENCOUNTER — Ambulatory Visit (INDEPENDENT_AMBULATORY_CARE_PROVIDER_SITE_OTHER): Payer: 59 | Admitting: *Deleted

## 2011-01-11 DIAGNOSIS — R0989 Other specified symptoms and signs involving the circulatory and respiratory systems: Secondary | ICD-10-CM

## 2011-01-15 NOTE — Progress Notes (Signed)
Patient notified normal Echo.

## 2011-01-17 NOTE — Procedures (Unsigned)
DUPLEX ULTRASOUND OF ABDOMINAL AORTA  INDICATION:  Palpable abdominal mass.  HISTORY: Diabetes:  No. Cardiac:  No. Hypertension:  No. Smoking:  Previous.  Quit, January 2011. Connective Tissue Disorder: Family History:  No. Previous Surgery:  No.  DUPLEX EXAM:         AP (cm)                   TRANSVERSE (cm) Proximal             1.39 cm                   1.49 cm Mid                  1.34 cm                   1.32 cm Distal               1.24 cm                   1.29 cm Right Iliac          0.73 cm                   0.85 cm Left Iliac           0.75 cm                   0.86 cm  PREVIOUS:  Date:  AP:  TRANSVERSE:  IMPRESSION:  No evidence of abdominal aortic aneurysm.  ___________________________________________ Larina Earthly, M.D.  EM/MEDQ  D:  01/11/2011  T:  01/11/2011  Job:  [id number]

## 2011-01-28 ENCOUNTER — Telehealth: Payer: Self-pay | Admitting: *Deleted

## 2011-01-28 NOTE — Telephone Encounter (Signed)
Patient called with abd aortic U/S results/ msg left that it was normal. Told to call with further questions or concerns. Alfonso Ramus RN

## 2011-01-30 ENCOUNTER — Encounter: Payer: Self-pay | Admitting: Cardiovascular Disease

## 2011-02-01 ENCOUNTER — Ambulatory Visit: Payer: 59 | Admitting: Vascular Surgery

## 2011-02-01 ENCOUNTER — Ambulatory Visit: Payer: 59 | Admitting: Cardiovascular Disease

## 2011-02-28 ENCOUNTER — Encounter: Payer: Self-pay | Admitting: Cardiovascular Disease

## 2011-02-28 ENCOUNTER — Ambulatory Visit (INDEPENDENT_AMBULATORY_CARE_PROVIDER_SITE_OTHER): Payer: 59 | Admitting: Cardiovascular Disease

## 2011-02-28 VITALS — BP 120/80 | HR 71 | Ht 62.0 in | Wt 135.0 lb

## 2011-02-28 DIAGNOSIS — R0602 Shortness of breath: Secondary | ICD-10-CM

## 2011-02-28 DIAGNOSIS — R0989 Other specified symptoms and signs involving the circulatory and respiratory systems: Secondary | ICD-10-CM

## 2011-02-28 NOTE — Progress Notes (Signed)
Danielle Solis Date of Birth  1955/08/09 [facility] [address]    [address] [place], [place]  [address] [phone]  Fax  [phone]  History of Present Illness:  Danielle Solis is a 55 year old female who I saw several months ago for palpitations. She was also having some chest discomfort. She has a history of cigarette smoking. She had a negative stress Myoview study. She also had an echocardiogram that revealed normal left ventricular systolic function. We performed an abdominal ultrasound which did not show any abdominal aortic aneurysm.   She continues to have a few palpitations. Otherwise she is feeling well. She exercises every day.  Current Outpatient Prescriptions on File Prior to Visit  Medication Sig Dispense Refill  . aspirin EC 81 MG tablet Take 1 tablet (81 mg total) by mouth daily.      . cyclobenzaprine (FLEXERIL) 10 MG tablet Take 1 tablet (10 mg total) by mouth every 8 (eight) hours as needed for muscle spasms (warn of sedation).  30 tablet  0  . loratadine (CLARITIN) 10 MG tablet Take 10 mg by mouth daily.        . valACYclovir (VALTREX) 500 MG tablet Take one tablet twice a day for 5 days as needed for cold sore  10 tablet  1    Allergies  Allergen Reactions  . Contrast Media (Iodinated Diagnostic Agents)     1984 - contrast was likely renagrafin.    Past Medical History  Diagnosis Date  . Allergic rhinitis   . Tobacco abuse   . Palpitations     Past Surgical History  Procedure Date  . Gyn surgery     hysterectomy for endometriosis  . Nephrectomy 1986  . Oophorectomy 1990's  . Tonsillectomy     History  Smoking status  . Former Smoker -- 40 years  . Types: Cigarettes  . Quit date: 12/30/2008  Smokeless tobacco  . Not on file    History  Alcohol Use  . 0.5 oz/week  . 1 drink(s) per week    Family History  Problem Relation Age of Onset  . Hypertension Mother   . Coronary artery disease Mother     PTCA  . Heart disease Mother   .  Coronary artery disease Father     stents  . Hypertension Father   . Alcohol abuse Father   . Cancer Father     lung  . Aortic stenosis Son     aortic valve replacement and root replacement for bicuspid aortic valve  . Coronary artery disease Maternal Aunt 40  . Coronary artery disease Maternal Uncle 40    Reviw of Systems:  Reviewed in the HPI.  All other systems are negative.  Physical Exam: BP 120/80  Pulse 71  Ht 5\' 2"  (1.575 m)  Wt 135 lb (61.236 kg)  BMI 24.69 kg/m2 The patient is alert and oriented x 3.  The mood and affect are normal.   Skin: warm and dry.  Color is normal.    HEENT:   The carotids are normal. There is no JVD. Her neck is supple.  Lungs: Her lungs are clear.   Heart: Heart regular S1-S2. She has no murmurs.    Abdomen: Nontender. I can palpate her abdominal aorta. There are no bruits.  Extremities:  No clubbing cyanosis or edema.  Neuro:  Nonfocal, her gait is normal.     ECG: Normal sinus rhythm. Chest normal EKG.  Assessment / Plan:

## 2011-02-28 NOTE — Assessment & Plan Note (Signed)
Her stress Myoview study was negative for ischemia. Her echocardiogram is essentially normal. At this point I don't think that she has any significant cardiovascular issues. We'll see her again on an as-needed basis.

## 2011-02-28 NOTE — Assessment & Plan Note (Signed)
The abdominal aortic aneurysm reveals no evidence of abdominal aortic aneurysm. We'll continue to follow this.

## 2011-02-28 NOTE — Patient Instructions (Signed)
Your physician recommends that you follow up only AS NEEDED.

## 2011-03-13 ENCOUNTER — Encounter: Payer: Self-pay | Admitting: Family Medicine

## 2011-03-13 ENCOUNTER — Ambulatory Visit (INDEPENDENT_AMBULATORY_CARE_PROVIDER_SITE_OTHER): Payer: 59 | Admitting: Family Medicine

## 2011-03-13 VITALS — BP 126/78 | HR 64 | Temp 98.2°F | Ht 62.0 in | Wt 136.2 lb

## 2011-03-13 DIAGNOSIS — J40 Bronchitis, not specified as acute or chronic: Secondary | ICD-10-CM

## 2011-03-13 DIAGNOSIS — J029 Acute pharyngitis, unspecified: Secondary | ICD-10-CM

## 2011-03-13 MED ORDER — AZITHROMYCIN 250 MG PO TABS: ORAL_TABLET | ORAL | Status: AC

## 2011-03-13 MED ORDER — TRIAMCINOLONE ACETONIDE 0.5 % EX CREA: 1.0000 "application " | TOPICAL_CREAM | Freq: Every day | CUTANEOUS | Status: DC | PRN

## 2011-03-13 NOTE — Progress Notes (Signed)
Subjective:    Patient ID: Danielle Solis, female    DOB: 04-11-1956, 55 y.o.   MRN: [id number]  HPI Here for uri symptoms  Sore throat- neg rapid strep Now prod cough with yellow phlegm Started Thursday and got worse-- really down since sat  Had bad headache for several days Now crud is really going into her chest  Nasal cong is imp Tight chest- no wheeze ? If fever   Patient Active Problem List  Diagnoses  . FEVER BLISTER  . TOBACCO USE  . SINUSITIS- ACUTE-NOS  . ALLERGIC RHINITIS  . POSTMENOPAUSAL STATUS  . DERMATITIS, ATOPIC  . BACK PAIN, LUMBAR  . THUMB PAIN, RIGHT  . SOB (shortness of breath)  . Palpable abdominal aorta  . Bronchitis   Past Medical History  Diagnosis Date  . Allergic rhinitis   . Tobacco abuse   . Palpitations    Past Surgical History  Procedure Date  . Gyn surgery     hysterectomy for endometriosis  . Nephrectomy 1986  . Oophorectomy 1990's  . Tonsillectomy    History  Substance Use Topics  . Smoking status: Former Smoker -- 40 years    Types: Cigarettes    Quit date: 12/30/2008  . Smokeless tobacco: Not on file  . Alcohol Use: 0.5 oz/week    1 drink(s) per week   Family History  Problem Relation Age of Onset  . Hypertension Mother   . Coronary artery disease Mother     PTCA  . Heart disease Mother   . Coronary artery disease Father     stents  . Hypertension Father   . Alcohol abuse Father   . Cancer Father     lung  . Aortic stenosis Son     aortic valve replacement and root replacement for bicuspid aortic valve  . Coronary artery disease Maternal Aunt 40  . Heart disease Maternal Aunt 40    CAD  . Coronary artery disease Maternal Uncle 40  . Heart disease Maternal Uncle 40    CAD   Allergies  Allergen Reactions  . Contrast Media (Iodinated Diagnostic Agents)     1984 - contrast was likely renagrafin.   Current Outpatient Prescriptions on File Prior to Visit  Medication Sig Dispense Refill  . aspirin EC 81 MG  tablet Take 1 tablet (81 mg total) by mouth daily.      Marland Kitchen loratadine (CLARITIN) 10 MG tablet Take 10 mg by mouth daily.        . cyclobenzaprine (FLEXERIL) 10 MG tablet Take 1 tablet (10 mg total) by mouth every 8 (eight) hours as needed for muscle spasms (warn of sedation).  30 tablet  0  . valACYclovir (VALTREX) 500 MG tablet Take one tablet twice a day for 5 days as needed for cold sore  10 tablet  1      Review of Systems Review of Systems  Constitutional: Negative for fever, appetite change, fatigue and unexpected weight change.  Eyes: Negative for pain and visual disturbance.  ENT pos for congestion/ st/ neg for sinus pain  Respiratory: Negative for sob/ pos for cough.   Cardiovascular: Negative for cp or palpitations    Gastrointestinal: Negative for nausea, diarrhea and constipation.  Genitourinary: Negative for urgency and frequency.  Skin: Negative for pallor or rash   Neurological: Negative for weakness, light-headedness, numbness and headaches.  Hematological: Negative for adenopathy. Does not bruise/bleed easily.  Psychiatric/Behavioral: Negative for dysphoric mood. The patient is not nervous/anxious.  Objective:   Physical Exam  Constitutional: She appears well-developed and well-nourished. No distress.  HENT:  Head: Normocephalic and atraumatic.  Right Ear: External ear normal.  Left Ear: External ear normal.       Nares are injected and congested  Mild maxillary sinus tenderness  Eyes: Conjunctivae and EOM are normal. Pupils are equal, round, and reactive to light. Right eye exhibits no discharge. Left eye exhibits no discharge.  Neck: Normal range of motion. Neck supple. No JVD present. No thyromegaly present.  Cardiovascular: Normal rate, regular rhythm and normal heart sounds.   Pulmonary/Chest: Breath sounds normal. No respiratory distress. She has no wheezes. She has no rales. She exhibits no tenderness.       Harsh bs worse at bases No wheeze Few  scattered rhonchi   Lymphadenopathy:    She has no cervical adenopathy.  Skin: Skin is warm and dry. No rash noted. No erythema. No pallor.  Psychiatric: She has a normal mood and affect.          Assessment & Plan:

## 2011-03-13 NOTE — Assessment & Plan Note (Signed)
After uri with worsening prod cough , tight chest but no wheeze  Will cover with zithromax and sympt care- see inst  Update if worse or not starting to imp in 1 wk

## 2011-03-13 NOTE — Patient Instructions (Signed)
Take the zithromax as directed Drink lots of fluids If worse or not improving in a week - call  Try to get extra rest

## 2011-10-02 ENCOUNTER — Other Ambulatory Visit: Payer: Self-pay | Admitting: Family Medicine

## 2012-02-06 ENCOUNTER — Encounter: Payer: Self-pay | Admitting: Family Medicine

## 2012-02-06 ENCOUNTER — Ambulatory Visit (INDEPENDENT_AMBULATORY_CARE_PROVIDER_SITE_OTHER): Payer: 59 | Admitting: Family Medicine

## 2012-02-06 VITALS — BP 144/92 | HR 72 | Temp 98.0°F | Wt 137.8 lb

## 2012-02-06 DIAGNOSIS — T148 Other injury of unspecified body region: Secondary | ICD-10-CM

## 2012-02-06 DIAGNOSIS — R21 Rash and other nonspecific skin eruption: Secondary | ICD-10-CM | POA: Insufficient documentation

## 2012-02-06 DIAGNOSIS — IMO0002 Reserved for concepts with insufficient information to code with codable children: Secondary | ICD-10-CM

## 2012-02-06 DIAGNOSIS — W458XXA Other foreign body or object entering through skin, initial encounter: Secondary | ICD-10-CM

## 2012-02-06 DIAGNOSIS — S76219A Strain of adductor muscle, fascia and tendon of unspecified thigh, initial encounter: Secondary | ICD-10-CM

## 2012-02-06 DIAGNOSIS — T148XXA Other injury of unspecified body region, initial encounter: Secondary | ICD-10-CM

## 2012-02-06 DIAGNOSIS — W57XXXA Bitten or stung by nonvenomous insect and other nonvenomous arthropods, initial encounter: Secondary | ICD-10-CM

## 2012-02-06 NOTE — Progress Notes (Signed)
  Subjective:    Patient ID: Danielle Solis, female    DOB: 02/22/1956, 56 y.o.   MRN: [id number]  HPI CC: check tick bite, check nevus  (07/2011) 7 mo ago pulled small seed tick off right inner thigh, intermittently becomes itchy, red, warm.  Tick present <24 hours.    Also wants left leg mole checked.  Present for 6 mo.  Raised, itchy, scaly.  Now with groin pain - feels like she's pulled her groin.  Denies injury/trauma to groin.  Is on feet 8 hours/day, works Engineering ge[employer].  No fevers/chills, abd pain, vomiting, new rashes, joint pains.  H/o mild arthritis.  + occasional headaches but no change in last 6 mo. Some cold intolerance.  Some fatigue.  Feeling more stressed recently. Lab Results  Component Value Date   TSH 1.41 10/20/2007    Review of Systems Per HPI    Objective:   Physical Exam  Nursing note and vitals reviewed. Constitutional: She appears well-developed and well-nourished. No distress.  Musculoskeletal: She exhibits no edema.       FROM at hips bilaterally.  No pain with int/ext rotation at hip.  Neg SLR bilaterally.  Neg FABER.  Mild discomfort with left hip flexion, none with abduction.  Skin: Skin is warm and dry. Rash noted.       Left lower anterior leg with slightly erythematous raised scaly lesion, pruritic. Right inner thigh with chronic small scab and minimal surrounding erythema, about 5mm diameter, pruritic, nontender.  Central to scab is small darker area.  Psychiatric: She has a normal mood and affect.       Assessment & Plan:

## 2012-02-06 NOTE — Addendum Note (Signed)
Addended by: Josph Macho A on: 02/06/2012 10:22 AM   Modules accepted: Orders

## 2012-02-06 NOTE — Assessment & Plan Note (Signed)
Discussed, reassurred. To update [place] if not improving as expected. Stretching exercises from [facility] pt advisor provided today.

## 2012-02-06 NOTE — Addendum Note (Signed)
Addended by: Josph Macho A on: 02/06/2012 10:42 AM   Modules accepted: Orders

## 2012-02-06 NOTE — Assessment & Plan Note (Signed)
?  irritated SK vs early AK.  Monitor for now.

## 2012-02-06 NOTE — Assessment & Plan Note (Signed)
Suspect residual tick head present - shave biopsy performed. IC obtained and in chart.  Area identified - cleaned with alcohol swab, and anesthesia achieved with 1cc lidocaine with epi.  Shave biopsy performed of site.  Hemostasis achieved with silver nitrate.  Triple abx ointment applied and bandaid applied.  Pt tolerated well.  Red flags to seek care discussed. Specimen to path.

## 2012-02-06 NOTE — Patient Instructions (Signed)
Skin spot looking ok on left lower leg - if growing or changing, please let us know. Right thigh at site of tick bite - shave biopsy performed today.   Keep area clean and dry, may use antibiotic ointment on this.  Let us know if spreading redness or draining pus. I do think you have groin strain - treat with anti inflammatories (ibuprofen 400mg ) and leg rest.  Let us know if not improving as expected.

## 2012-02-17 ENCOUNTER — Other Ambulatory Visit: Payer: Self-pay | Admitting: Family Medicine

## 2012-04-18 ENCOUNTER — Telehealth: Payer: Self-pay | Admitting: Family Medicine

## 2012-04-18 DIAGNOSIS — Z0000 Encounter for general adult medical examination without abnormal findings: Secondary | ICD-10-CM

## 2012-04-18 NOTE — Telephone Encounter (Signed)
Message copied by Judy Pimple on Sat Apr 18, 2012  6:30 PM ------      Message from: Danielle Solis      Created: Wed Apr 15, 2012  1:12 PM      Regarding: Cpx labs Mon 12/16       Please order  future cpx labs for pt's upcoming lab appt.      Thanks      Rodney Booze

## 2012-04-20 ENCOUNTER — Other Ambulatory Visit (INDEPENDENT_AMBULATORY_CARE_PROVIDER_SITE_OTHER): Payer: Managed Care, Other (non HMO)

## 2012-04-20 DIAGNOSIS — Z0000 Encounter for general adult medical examination without abnormal findings: Secondary | ICD-10-CM

## 2012-04-20 LAB — CBC WITH DIFFERENTIAL/PLATELET
Basophils Relative: 0.8 % (ref 0.0–3.0)
Eosinophils Absolute: 0.1 10*3/uL (ref 0.0–0.7)
Eosinophils Relative: 1.8 % (ref 0.0–5.0)
Hemoglobin: 13.7 g/dL (ref 12.0–15.0)
Lymphocytes Relative: 31.5 % (ref 12.0–46.0)
MCHC: 34.3 g/dL (ref 30.0–36.0)
Monocytes Relative: 5.7 % (ref 3.0–12.0)
Neutro Abs: 3 10*3/uL (ref 1.4–7.7)
RBC: 4.48 Mil/uL (ref 3.87–5.11)

## 2012-04-20 LAB — COMPREHENSIVE METABOLIC PANEL
BUN: 19 mg/dL (ref 6–23)
CO2: 26 mEq/L (ref 19–32)
Calcium: 9.4 mg/dL (ref 8.4–10.5)
Chloride: 105 mEq/L (ref 96–112)
Creatinine, Ser: 0.9 mg/dL (ref 0.4–1.2)
GFR: 64.88 mL/min (ref >60.00)
Glucose, Bld: 99 mg/dL (ref 70–99)

## 2012-04-20 LAB — LIPID PANEL
HDL: 66.5 mg/dL (ref >39.00)
Triglycerides: 102 mg/dL (ref 0.0–149.0)

## 2012-04-30 ENCOUNTER — Ambulatory Visit (INDEPENDENT_AMBULATORY_CARE_PROVIDER_SITE_OTHER): Payer: Managed Care, Other (non HMO) | Admitting: Family Medicine

## 2012-04-30 ENCOUNTER — Encounter: Payer: Self-pay | Admitting: Family Medicine

## 2012-04-30 VITALS — BP 128/78 | HR 81 | Temp 98.1°F | Ht 61.5 in | Wt 140.0 lb

## 2012-04-30 DIAGNOSIS — F17200 Nicotine dependence, unspecified, uncomplicated: Secondary | ICD-10-CM

## 2012-04-30 DIAGNOSIS — Z0000 Encounter for general adult medical examination without abnormal findings: Secondary | ICD-10-CM

## 2012-04-30 DIAGNOSIS — Z1231 Encounter for screening mammogram for malignant neoplasm of breast: Secondary | ICD-10-CM

## 2012-04-30 DIAGNOSIS — E785 Hyperlipidemia, unspecified: Secondary | ICD-10-CM

## 2012-04-30 MED ORDER — VARENICLINE TARTRATE 0.5 MG PO TABS: 0.5000 mg | ORAL_TABLET | Freq: Two times a day (BID) | ORAL | Status: DC

## 2012-04-30 MED ORDER — VARENICLINE TARTRATE 0.5 MG X 11 & 1 MG X 42 PO MISC: ORAL | Status: DC

## 2012-04-30 NOTE — Progress Notes (Signed)
Subjective:    Patient ID: Danielle Solis, female    DOB: Jan 14, 1956, 56 y.o.   MRN: [id number]  HPI Here for health maintenance exam and to review chronic medical problems    Is feeling good overall   Wt is up 3 lb with bmi of 26  Has had hysterectomy for endometriosis in past No gyn problems at all   mammo 09- has been quite a while  Now her insurance will finally cover one  Wants to go to to the breast center at [address] Self exam- no lumps or changes   Colon cancer screen -has never had a colonoscopy  Wants ref for colonoscopy later in the year   Zoster status - will check on insurance   Flu vaccine- she declines them   dexa nl in 5/03 Is not taking ca or D for bones   Smoking status - she started smoking again  Lost her mother - very stressful year overall - many sick family members  Also several family members smoked  How much she smokes depends on the day  Is going to set a quit date and get back on chantix   Cholesterol is high Lab Results  Component Value Date   CHOL 242* 04/20/2012   CHOL 193 10/20/2007   Lab Results  Component Value Date   HDL 66.50 04/20/2012   HDL 46.3 10/20/2007   Lab Results  Component Value Date   LDLCALC 128* 10/20/2007   Lab Results  Component Value Date   TRIG 102.0 04/20/2012   TRIG 92 10/20/2007   Lab Results  Component Value Date   CHOLHDL 4 04/20/2012   CHOLHDL 4.2 CALC 10/20/2007   Lab Results  Component Value Date   LDLDIRECT 165.3 04/20/2012    Her diet has not been as good - is ready to make some changes  Wants to re check in 3 months  Is planning to get back on track-knows what to watch   Patient Active Problem List  Diagnosis  . FEVER BLISTER  . TOBACCO USE  . SINUSITIS- ACUTE-NOS  . ALLERGIC RHINITIS  . POSTMENOPAUSAL STATUS  . DERMATITIS, ATOPIC  . BACK PAIN, LUMBAR  . THUMB PAIN, RIGHT  . SOB (shortness of breath)  . Palpable abdominal aorta  . Bronchitis  . Skin rash  . Tick bite  .  Groin strain  . Routine general medical examination at a health care facility   Past Medical History  Diagnosis Date  . Allergic rhinitis   . Tobacco abuse   . Palpitations    Past Surgical History  Procedure Date  . Gyn surgery     hysterectomy for endometriosis  . Nephrectomy 1986  . Oophorectomy 1990's  . Tonsillectomy    History  Substance Use Topics  . Smoking status: Current Every Day Smoker -- 0.3 packs/day    Types: Cigarettes    Last Attempt to Quit: 12/30/2008  . Smokeless tobacco: Not on file  . Alcohol Use: 0.5 oz/week    1 drink(s) per week     Comment: Occasional   Family History  Problem Relation Age of Onset  . Hypertension Mother   . Coronary artery disease Mother     PTCA  . Heart disease Mother   . Coronary artery disease Father     stents  . Hypertension Father   . Alcohol abuse Father   . Cancer Father     lung  . Aortic stenosis Son     aortic  valve replacement and root replacement for bicuspid aortic valve  . Coronary artery disease Maternal Aunt 40  . Heart disease Maternal Aunt 40    CAD  . Coronary artery disease Maternal Uncle 40  . Heart disease Maternal Uncle 40    CAD   Allergies  Allergen Reactions  . Contrast Media (Iodinated Diagnostic Agents)     1984 - contrast was likely renagrafin.   Current Outpatient Prescriptions on File Prior to Visit  Medication Sig Dispense Refill  . aspirin 81 MG tablet Take 81 mg by mouth daily.      . DiphenhydrAMINE HCl (BENADRYL ALLERGY PO) Take by mouth as directed.        Marland Kitchen ibuprofen (ADVIL,MOTRIN) 200 MG tablet Take 400 mg by mouth every 6 (six) hours as needed.        . loratadine (CLARITIN) 10 MG tablet Take 10 mg by mouth daily.        Marland Kitchen triamcinolone (KENALOG) 0.5 % cream Apply 1 application topically daily as needed.  30 g  5  . valACYclovir (VALTREX) 500 MG tablet TAKE 1 TABLET TWICE A DAY FOR 5 DAYS AS NEEDED FOR COLD SORE  10 tablet  1     Review of Systems Review of Systems    Constitutional: Negative for fever, appetite change, fatigue and unexpected weight change.  Eyes: Negative for pain and visual disturbance.  Respiratory: Negative for cough and shortness of breath.   Cardiovascular: Negative for cp or palpitations    Gastrointestinal: Negative for nausea, diarrhea and constipation.  Genitourinary: Negative for urgency and frequency.  Skin: Negative for pallor or rash   Neurological: Negative for weakness, light-headedness, numbness and headaches.  Hematological: Negative for adenopathy. Does not bruise/bleed easily.  Psychiatric/Behavioral: Negative for dysphoric mood. The patient is not nervous/anxious.  pos for stressors        Objective:   Physical Exam  Constitutional: She appears well-developed and well-nourished. No distress.  HENT:  Head: Normocephalic and atraumatic.  Right Ear: External ear normal.  Left Ear: External ear normal.  Nose: Nose normal.  Mouth/Throat: Oropharynx is clear and moist.  Eyes: Conjunctivae normal and EOM are normal. Pupils are equal, round, and reactive to light. Right eye exhibits no discharge. Left eye exhibits no discharge. No scleral icterus.  Neck: Normal range of motion. Neck supple. No JVD present. Carotid bruit is not present. No thyromegaly present.  Cardiovascular: Normal rate, regular rhythm, normal heart sounds and intact distal pulses.  Exam reveals no gallop.   Pulmonary/Chest: Effort normal and breath sounds normal. No respiratory distress. She has no wheezes. She has no rales.  Abdominal: Soft. Bowel sounds are normal. She exhibits no distension, no abdominal bruit and no mass. There is no tenderness.  Genitourinary: No breast swelling, tenderness, discharge or bleeding.       Breast exam: No mass, nodules, thickening, tenderness, bulging, retraction, inflamation, nipple discharge or skin changes noted.  No axillary or clavicular LA.  Chaperoned exam.    Musculoskeletal: She exhibits no edema and no  tenderness.  Lymphadenopathy:    She has no cervical adenopathy.  Neurological: She is alert. She has normal reflexes. No cranial nerve deficit. She exhibits normal muscle tone. Coordination normal.  Skin: Skin is warm and dry. No rash noted. No erythema. No pallor.  Psychiatric: She has a normal mood and affect.          Assessment & Plan:

## 2012-04-30 NOTE — Assessment & Plan Note (Signed)
Pt blames this on poor diet  In light of heart issues in fam and also smoking- goal LDL is under 100 Disc goals for lipids and reasons to control them Rev labs with pt Rev low sat fat diet in detail Re check 3 mo

## 2012-04-30 NOTE — Assessment & Plan Note (Signed)
Scheduled annual screening mammogram- first one at the breast center Nl breast exam today  Encouraged monthly self exams

## 2012-04-30 NOTE — Assessment & Plan Note (Signed)
Reviewed health habits including diet and exercise and skin cancer prevention Also reviewed health mt list, fam hx and immunizations  Rev wellness labs  Declines flu vaccine Plans to quit smoking

## 2012-04-30 NOTE — Assessment & Plan Note (Signed)
Pt plans to quit  Given chantix  Disc strategies for dealing with stress

## 2012-04-30 NOTE — Patient Instructions (Addendum)
Avoid red meat/ fried foods/ egg yolks/ fatty breakfast meats/ butter, cheese and high fat dairy/ and shellfish  Schedule fasting labs in 3 months for cholesterol Try to get 1200-1500 mg of calcium per day with at least 1000 iu of vitamin D - for bone health  Keep working on quitting smoking  If you are interested in a shingles/zoster vaccine - call your insurance to check on coverage,( you should not get it within 1 month of other vaccines) , then call us for a prescription  for it to take to a pharmacy that gives the shot , or make a nurse visit to get it here depending on your coverage  take care of yourself

## 2012-06-16 ENCOUNTER — Ambulatory Visit (INDEPENDENT_AMBULATORY_CARE_PROVIDER_SITE_OTHER): Payer: Managed Care, Other (non HMO) | Admitting: Family Medicine

## 2012-06-16 ENCOUNTER — Encounter: Payer: Self-pay | Admitting: Family Medicine

## 2012-06-16 VITALS — BP 134/82 | HR 84 | Temp 99.3°F | Ht 61.5 in | Wt 140.0 lb

## 2012-06-16 DIAGNOSIS — J069 Acute upper respiratory infection, unspecified: Secondary | ICD-10-CM | POA: Insufficient documentation

## 2012-06-16 DIAGNOSIS — J029 Acute pharyngitis, unspecified: Secondary | ICD-10-CM

## 2012-06-16 MED ORDER — ALBUTEROL SULFATE HFA 108 (90 BASE) MCG/ACT IN AERS: 2.0000 | INHALATION_SPRAY | RESPIRATORY_TRACT | Status: DC | PRN

## 2012-06-16 NOTE — Patient Instructions (Addendum)
Fluids/ rest  Albuterol inhaler if needed for wheezing Ibuprofen or aleve with food for headache/ fever Tussin for cough  Update if not starting to improve in a week or if worsening

## 2012-06-16 NOTE — Progress Notes (Signed)
Subjective:    Patient ID: Danielle Solis, female    DOB: 1955-08-28, 57 y.o.   MRN: [id number]  HPI Here with st and uri symptoms  Last week did not feel good  Friday/ sat - chest tightness and a bit of wheezing  "Sunday - started a deep croupy cough- mucous brown in color  Yesterday - head is congested/ sinus presssure and ST   St is not severe  Temp 99.3- thinks she has had a fever at home- aches  Did not get a flu shot this year   Patient Active Problem List  Diagnosis  . FEVER BLISTER  . TOBACCO USE  . ALLERGIC RHINITIS  . POSTMENOPAUSAL STATUS  . DERMATITIS, ATOPIC  . SOB (shortness of breath)  . Palpable abdominal aorta  . Routine general medical examination at a health care facility  . Other screening mammogram  . Hyperlipidemia   Past Medical History  Diagnosis Date  . Allergic rhinitis   . Tobacco abuse   . Palpitations    Past Surgical History  Procedure Laterality Date  . Gyn surgery      hysterectomy for endometriosis  . Nephrectomy  1986  . Oophorectomy  1990's  . Tonsillectomy     History  Substance Use Topics  . Smoking status: Former Smoker    Types: Cigarettes    Quit date: 04/30/2012  . Smokeless tobacco: Not on file  . Alcohol Use: 0.5 oz/week    1 drink(s) per week     Comment: Occasional   Family History  Problem Relation Age of Onset  . Hypertension Mother   . Coronary artery disease Mother     PTCA  . Heart disease Mother   . Coronary artery disease Father     stents  . Hypertension Father   . Alcohol abuse Father   . Cancer Father     lung  . Aortic stenosis Son     aortic valve replacement and root replacement for bicuspid aortic valve  . Coronary artery disease Maternal Aunt 40  . Heart disease Maternal Aunt 40    CAD  . Coronary artery disease Maternal Uncle 40  . Heart disease Maternal Uncle 40    CAD   Allergies  Allergen Reactions  . Contrast Media (Iodinated Diagnostic Agents)     19" 84 - contrast was likely  renagrafin.   Current Outpatient Prescriptions on File Prior to Visit  Medication Sig Dispense Refill  . aspirin 81 MG tablet Take 81 mg by mouth daily.      . DiphenhydrAMINE HCl (BENADRYL ALLERGY PO) Take by mouth as directed.        Marland Kitchen ibuprofen (ADVIL,MOTRIN) 200 MG tablet Take 400 mg by mouth every 6 (six) hours as needed.        . loratadine (CLARITIN) 10 MG tablet Take 10 mg by mouth daily.        Marland Kitchen triamcinolone (KENALOG) 0.5 % cream Apply 1 application topically daily as needed.  30 g  5  . valACYclovir (VALTREX) 500 MG tablet TAKE 1 TABLET TWICE A DAY FOR 5 DAYS AS NEEDED FOR COLD SORE  10 tablet  1  . varenicline (CHANTIX STARTING MONTH PAK) 0.5 MG X 11 & 1 MG X 42 tablet Take one 0.5 mg tablet by mouth once daily for 3 days, then increase to one 0.5 mg tablet twice daily for 4 days, then increase to one 1 mg tablet twice daily.  53 tablet  0   No  current facility-administered medications on file prior to visit.      Review of Systems Review of Systems  Constitutional: Negative for appetite change, and unexpected weight change.  ENT pos for st / neg for ear drainage or sinus pain  Eyes: Negative for pain and visual disturbance.  Respiratory: Negative for  shortness of breath.   Cardiovascular: Negative for cp or palpitations    Gastrointestinal: Negative for nausea, diarrhea and constipation.  Genitourinary: Negative for urgency and frequency.  Skin: Negative for pallor or rash   Neurological: Negative for weakness, light-headedness, numbness and headaches.  Hematological: Negative for adenopathy. Does not bruise/bleed easily.  Psychiatric/Behavioral: Negative for dysphoric mood. The patient is not nervous/anxious.         Objective:   Physical Exam  Constitutional: She appears well-developed and well-nourished. No distress.  HENT:  Head: Normocephalic and atraumatic.  Right Ear: External ear normal.  Left Ear: External ear normal.  Nose: Nose normal.  Mouth/Throat:  Oropharynx is clear and moist.  Nares are injected and congested  No sinus tenderness Clear post nasal drip  Eyes: Conjunctivae and EOM are normal. Pupils are equal, round, and reactive to light. Right eye exhibits no discharge. Left eye exhibits no discharge. No scleral icterus.  Neck: Normal range of motion. Neck supple. No JVD present. Carotid bruit is not present. No thyromegaly present.  Cardiovascular: Normal rate, regular rhythm and intact distal pulses.  Exam reveals no gallop.   Pulmonary/Chest: Effort normal and breath sounds normal. No respiratory distress. She has no wheezes. She has no rales.  No rales or rhonchi No wheeze at this time   Abdominal: She exhibits no abdominal bruit.  Lymphadenopathy:    She has no cervical adenopathy.  Skin: Skin is warm and dry. No rash noted.  Psychiatric: She has a normal mood and affect.          Assessment & Plan:

## 2012-06-16 NOTE — Assessment & Plan Note (Signed)
Disc symptomatic care - see instructions on AVS  Albuterol inhaler for need prn if wheezing Update if not starting to improve in a week or if worsening

## 2012-06-17 ENCOUNTER — Ambulatory Visit: Payer: Managed Care, Other (non HMO)

## 2012-07-20 ENCOUNTER — Other Ambulatory Visit: Payer: Self-pay | Admitting: Family Medicine

## 2012-07-20 ENCOUNTER — Ambulatory Visit
Admission: RE | Admit: 2012-07-20 | Discharge: 2012-07-20 | Disposition: A | Discharge status: Home / Self Care | Payer: Managed Care, Other (non HMO) | Source: Ambulatory Visit | Attending: Family Medicine | Admitting: Family Medicine

## 2012-07-20 DIAGNOSIS — Z1231 Encounter for screening mammogram for malignant neoplasm of breast: Secondary | ICD-10-CM

## 2012-07-20 IMAGING — MG MM DIGITAL SCREENING BILAT W/ CAD
12 series · 12 of 28 positions shown · non-contrast
Comparison: Previous exams.

CLINICAL DATA: Screening.

DIGITAL BILATERAL SCREENING MAMMOGRAM WITH CAD
DIGITAL BREAST TOMOSYNTHESIS
Digital breast tomosynthesis images are acquired in two
projections.  These images are reviewed in combination with the
digital mammogram, confirming the findings below.

[L MLO]
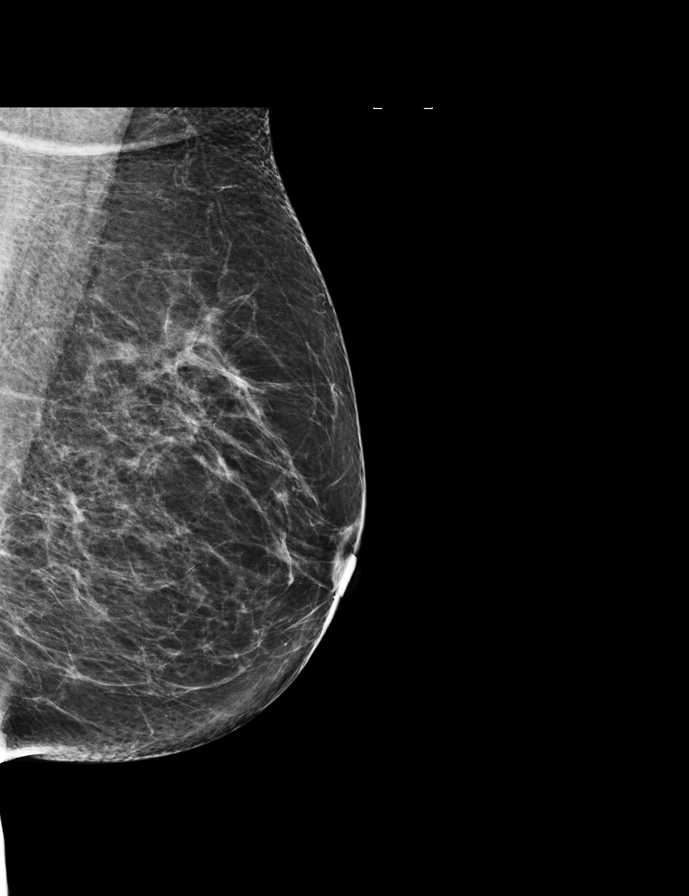

[L CC]
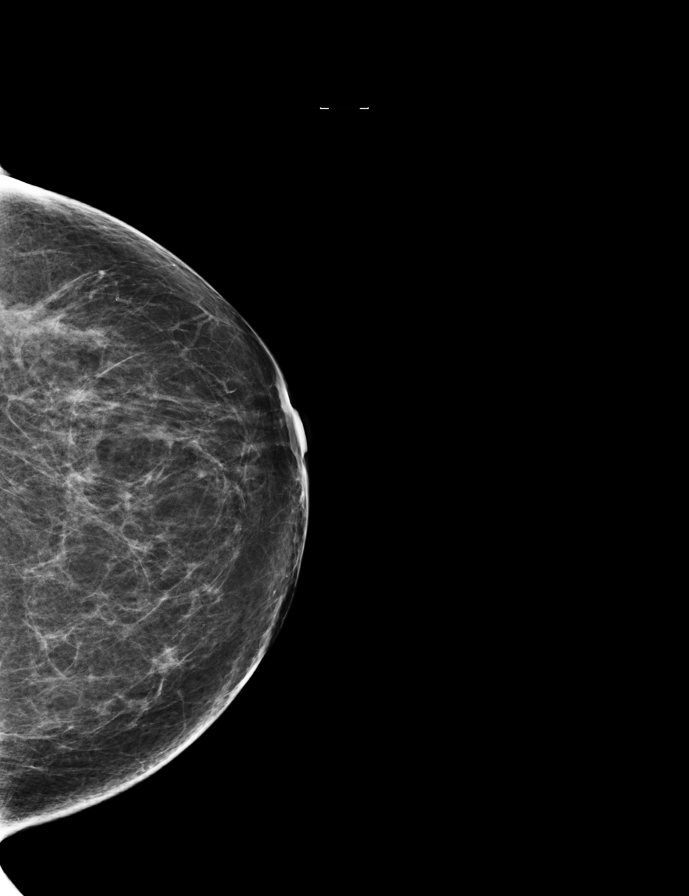

[R MLO]
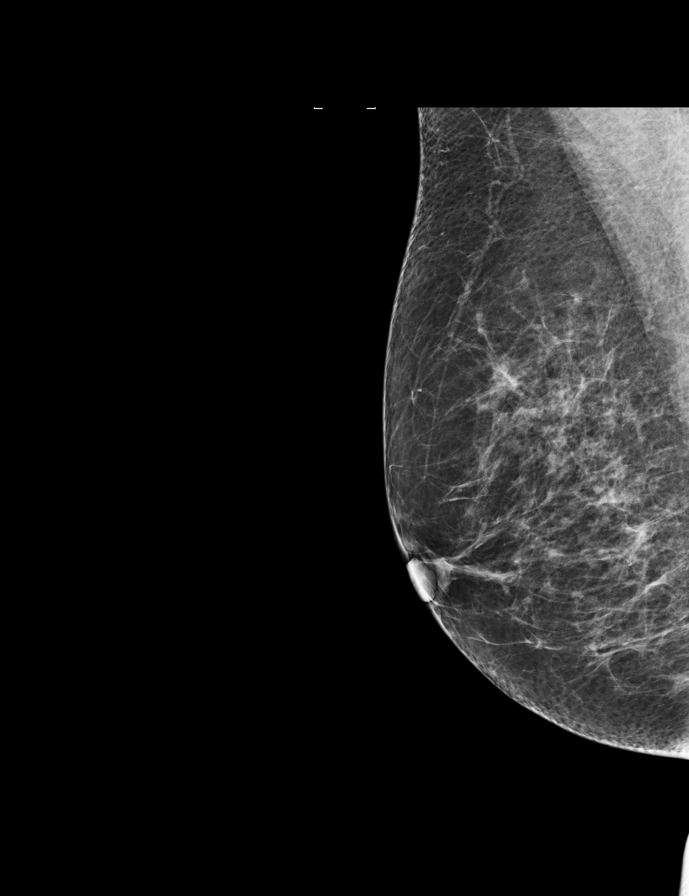

[R CC]
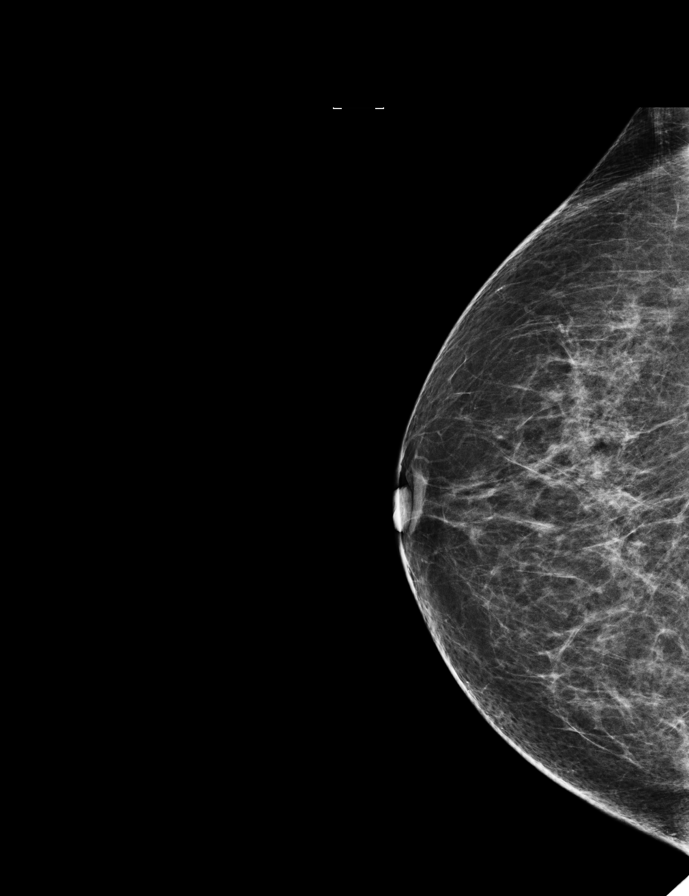

[R CC tomo (1 of 2)]
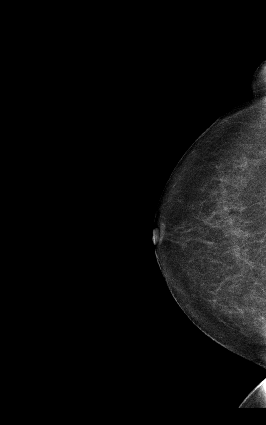

[L MLO tomo (1 of 2)]
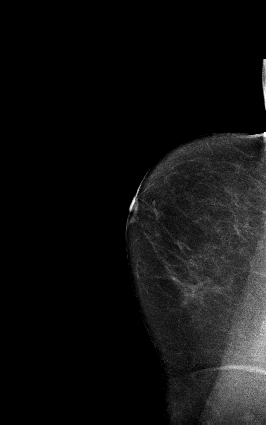

[L CC tomo (1 of 2)]
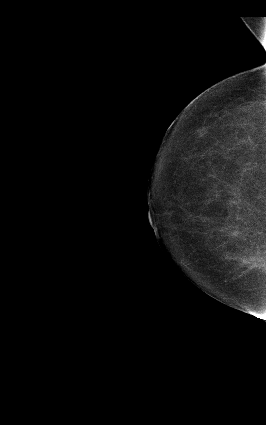

[R MLO tomo (1 of 2)]
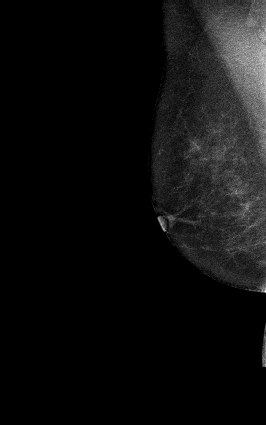

[L MLO tomo (2 of 2) · tomo slice 35/68.0]
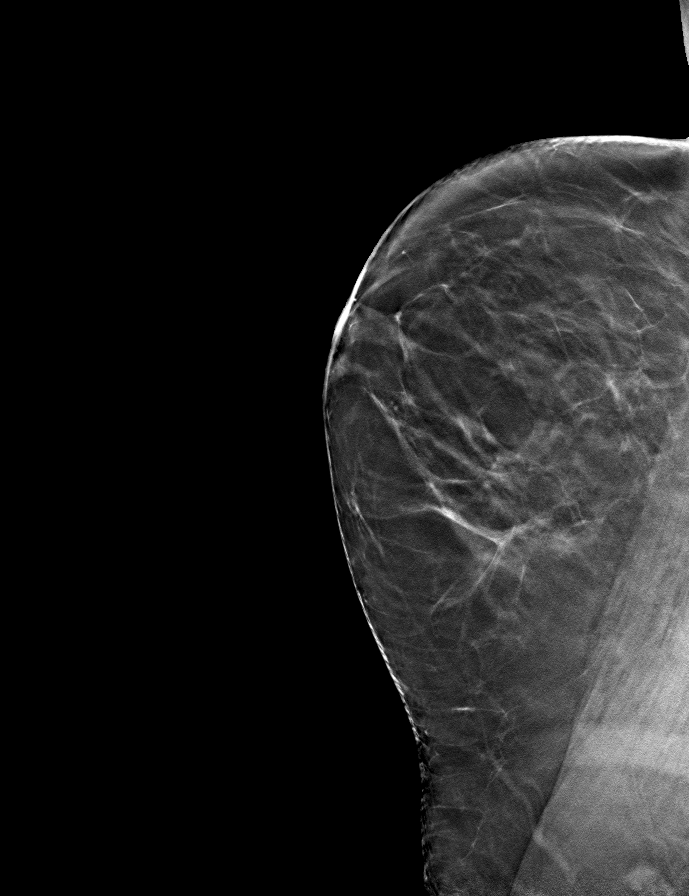

[L CC tomo (2 of 2) · tomo slice 37/72.0]
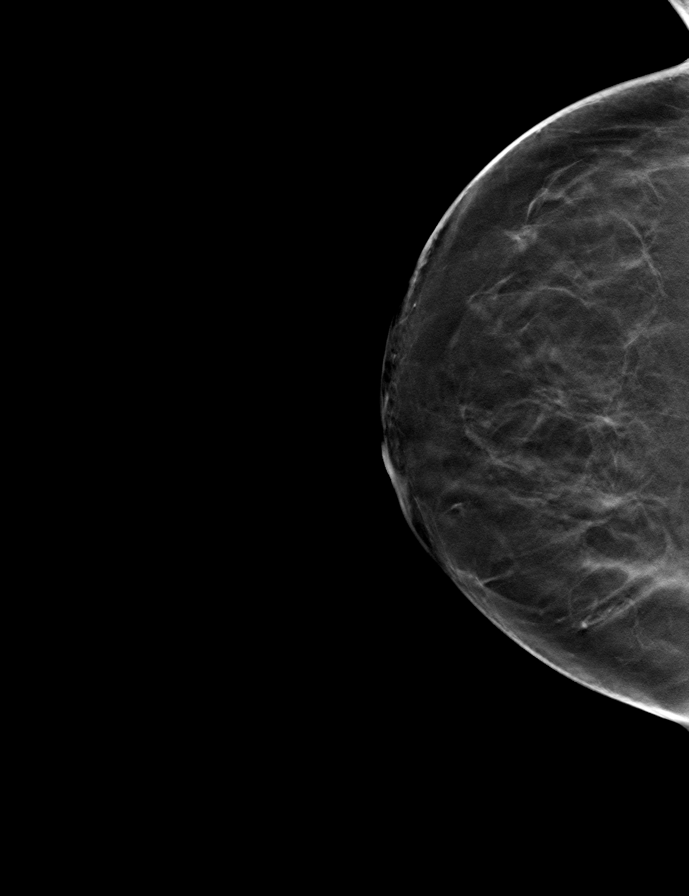

[R MLO tomo (2 of 2) · tomo slice 33/66.0]
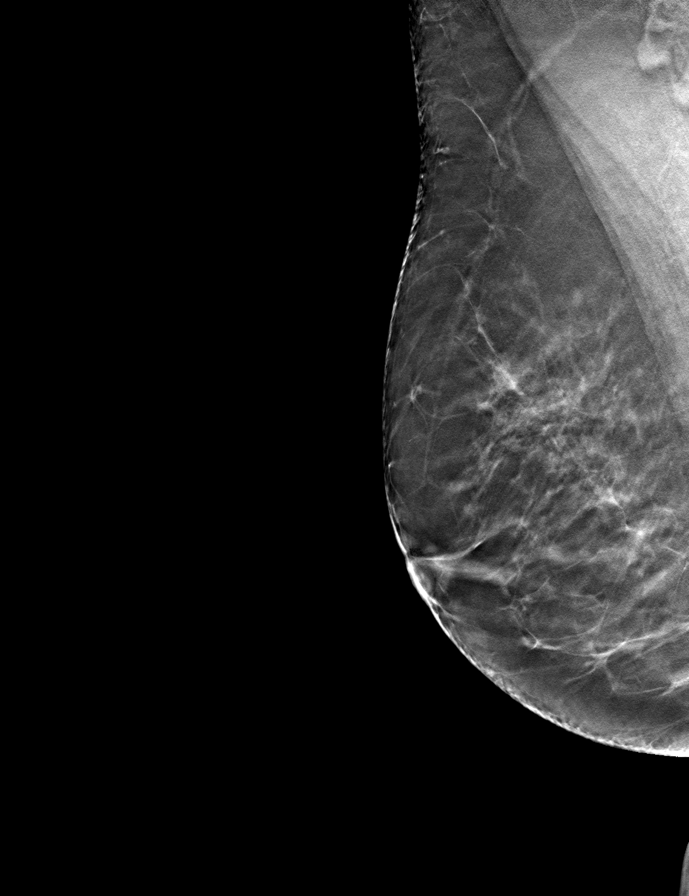

[R CC tomo (2 of 2) · tomo slice 35/68.0]
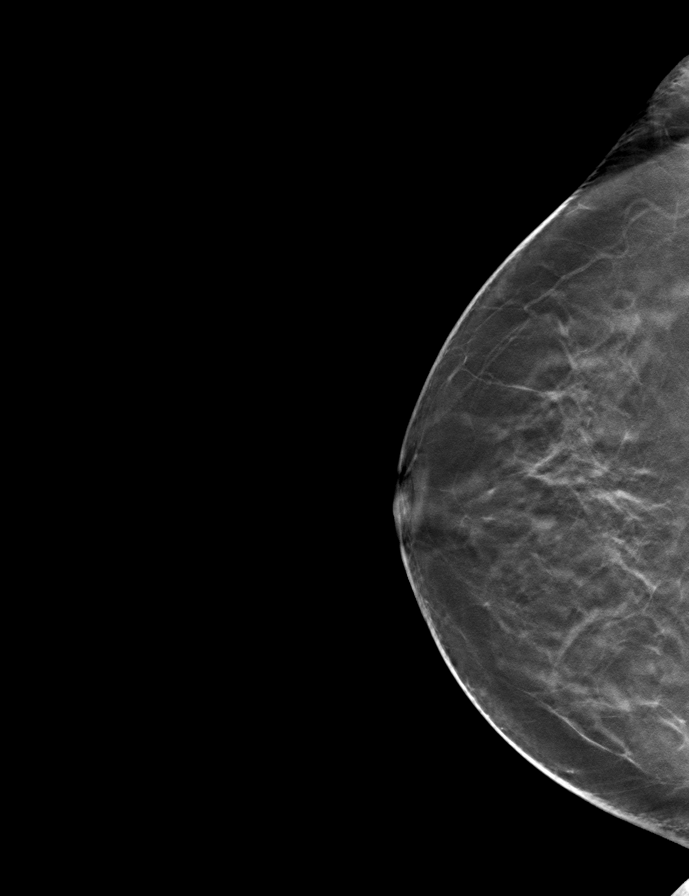

[12 of 28 positions shown; findings below may reference images not displayed]

FINDINGS: ACR Breast Density Category 2: There is a scattered fibroglandular
pattern.

No suspicious masses, architectural distortion, or calcifications
are present.

Images were processed with CAD.
IMPRESSION: No mammographic evidence of malignancy.

A result letter of this screening mammogram will be mailed directly
to the patient.

RECOMMENDATION:
Screening mammogram in one year. (Code:W9-V-L6H)

BI-RADS CATEGORY 1:  Negative.

## 2012-10-12 ENCOUNTER — Other Ambulatory Visit: Payer: Self-pay | Admitting: Family Medicine

## 2012-10-12 NOTE — Telephone Encounter (Signed)
done

## 2012-10-12 NOTE — Telephone Encounter (Signed)
Please give 6 refills of valtrex One of chantix thanks

## 2012-10-12 NOTE — Telephone Encounter (Signed)
Electronic refill request, please advise  

## 2013-01-05 ENCOUNTER — Other Ambulatory Visit: Payer: Self-pay | Admitting: Family Medicine

## 2013-01-05 NOTE — Telephone Encounter (Signed)
Please refill one time, thanks

## 2013-01-05 NOTE — Telephone Encounter (Signed)
Received refill request electronically. Last office visit 06/16/12. Is it okay to refill medication?

## 2013-01-06 NOTE — Telephone Encounter (Signed)
done

## 2013-02-09 ENCOUNTER — Telehealth: Payer: Self-pay | Admitting: Family Medicine

## 2013-02-09 NOTE — Telephone Encounter (Signed)
Where is the dermatitis- what does it look like - and is she also using moisturizers?

## 2013-02-09 NOTE — Telephone Encounter (Signed)
Pt left vm saying she needs stronger steroid cream for atopic dermatitis.  Currently taking Kenalog cream 0.5%.

## 2013-02-11 NOTE — Telephone Encounter (Signed)
Left voicemail requesting pt to call office 

## 2013-02-12 MED ORDER — BETAMETHASONE DIPROPIONATE 0.05 % EX CREA: TOPICAL_CREAM | Freq: Every day | CUTANEOUS | Status: DC

## 2013-02-12 NOTE — Telephone Encounter (Signed)
Dermatitis is on the side of her hands, red, itchy, scaly.  It seems to be seasonal when the weather changes.  She is using the low dose cortisone cream and Kenalog but it is not touching it.  She is using Eucerin Intensive Therapy as a moisturizer which is what seems to work best for her.  She says she thinks she would only need to use it for a week or so until she can get it cleared up.  [facility] [address]

## 2013-02-12 NOTE — Telephone Encounter (Signed)
Pt returned call to office last last pm, and left message on voicemail to call her back. I called and left a voicemail for her to return call.

## 2013-02-12 NOTE — Telephone Encounter (Signed)
We can try diprolene- I do not know if this will help more or not - the kenalog she is on is already considered high potency Give it a try Also continue eucerin cream Follow up if no imp  Px written for call in

## 2013-02-15 MED ORDER — BETAMETHASONE DIPROPIONATE 0.05 % EX CREA: TOPICAL_CREAM | Freq: Every day | CUTANEOUS | Status: AC

## 2013-02-15 NOTE — Telephone Encounter (Signed)
Left voicemail letting pt know Rx sent to pharmacy and to f/u if no improvement  

## 2013-05-17 ENCOUNTER — Other Ambulatory Visit: Payer: Managed Care, Other (non HMO)

## 2013-06-14 ENCOUNTER — Other Ambulatory Visit: Payer: Self-pay | Admitting: Family Medicine

## 2013-06-23 ENCOUNTER — Encounter: Payer: Self-pay | Admitting: Internal Medicine

## 2013-06-23 ENCOUNTER — Ambulatory Visit (INDEPENDENT_AMBULATORY_CARE_PROVIDER_SITE_OTHER): Payer: Managed Care, Other (non HMO) | Admitting: Internal Medicine

## 2013-06-23 VITALS — BP 122/72 | HR 85 | Temp 98.0°F | Wt 132.0 lb

## 2013-06-23 DIAGNOSIS — J069 Acute upper respiratory infection, unspecified: Secondary | ICD-10-CM

## 2013-06-23 MED ORDER — AZITHROMYCIN 250 MG PO TABS: ORAL_TABLET | ORAL | Status: DC

## 2013-06-23 NOTE — Progress Notes (Signed)
Pre-visit discussion using our clinic review tool. No additional management support is needed unless otherwise documented below in the visit note.  

## 2013-06-23 NOTE — Progress Notes (Signed)
HPI  Pt presents to the clinic today with c/o cough, chest congestion, chills and sore throat. This started 5 days ago. She does c/o thick clear mucous. She has tried OTC dayquil and Nyquil. She is also gargling with salt water. She does have a history of allergies but denies asthma. She has not had sick contacts. She is leaving for [place] tommorow and would like a zpack to take with her in case her symptoms get worse.  Review of Systems      Past Medical History  Diagnosis Date  . Allergic rhinitis   . Tobacco abuse   . Palpitations     Family History  Problem Relation Age of Onset  . Hypertension Mother   . Coronary artery disease Mother     PTCA  . Heart disease Mother   . Coronary artery disease Father     stents  . Hypertension Father   . Alcohol abuse Father   . Cancer Father     lung  . Aortic stenosis Son     aortic valve replacement and root replacement for bicuspid aortic valve  . Coronary artery disease Maternal Aunt 40  . Heart disease Maternal Aunt 7    CAD  . Coronary artery disease Maternal Uncle 40  . Heart disease Maternal Uncle 40    CAD    History   Social History  . Marital Status: Married    Spouse Name: N/A    Number of Children: 4  . Years of Education: N/A   Occupational History  . Not on file.   Social History Main Topics  . Smoking status: Former Smoker    Types: Cigarettes    Quit date: 04/30/2012  . Smokeless tobacco: Not on file  . Alcohol Use: 0.5 oz/week    1 drink(s) per week     Comment: Occasional  . Drug Use: No  . Sexual Activity: Not on file   Other Topics Concern  . Not on file   Social History Narrative  . No narrative on file    Allergies  Allergen Reactions  . Contrast Media [Iodinated Diagnostic Agents]     1984 - contrast was likely renagrafin.     Constitutional: Positive headache, fatigue. Denies fever or abrupt weight changes.  HEENT:  Positive sore throat. Denies eye redness, eye pain, pressure  behind the eyes, facial pain, nasal congestion, ear pain, ringing in the ears, wax buildup, runny nose or bloody nose. Respiratory: Positive cough. Denies difficulty breathing or shortness of breath.  Cardiovascular: Denies chest pain, chest tightness, palpitations or swelling in the hands or feet.   No other specific complaints in a complete review of systems (except as listed in HPI above).  Objective:   BP 122/72  Pulse 85  Temp(Src) 98 F (36.7 C) (Oral)  Wt 132 lb (59.875 kg)  SpO2 98% Wt Readings from Last 3 Encounters:  06/23/13 132 lb (59.875 kg)  06/16/12 140 lb (63.504 kg)  04/30/12 140 lb (63.504 kg)     General: Appears his stated age, well developed, well nourished in NAD. HEENT: Head: normal shape and size; Eyes: sclera white, no icterus, conjunctiva pink, PERRLA and EOMs intact; Ears: Tm's gray and intact, normal light reflex; Nose: mucosa pink and moist, septum midline; Throat/Mouth: + PND. Teeth present, mucosa erythematous and moist, no exudate noted, no lesions or ulcerations noted.  Neck: Mild cervical lymphadenopathy. Neck supple, trachea midline. No massses, lumps or thyromegaly present.  Cardiovascular: Normal rate and rhythm.  S1,S2 noted.  No murmur, rubs or gallops noted. No JVD or BLE edema. No carotid bruits noted. Pulmonary/Chest: Normal effort and positive vesicular breath sounds. No respiratory distress. No wheezes, rales or ronchi noted.      Assessment & Plan:   Upper Respiratory Infection:  Get some rest and drink plenty of water Do salt water gargles for the sore throat Likely viral at this point but since she is going to [place], will give eRx for Azithromax x 5 days to take with her   RTC as needed or if symptoms persist.

## 2013-06-23 NOTE — Patient Instructions (Addendum)

## 2013-09-08 ENCOUNTER — Telehealth: Payer: Self-pay | Admitting: Family Medicine

## 2013-09-08 DIAGNOSIS — Z0000 Encounter for general adult medical examination without abnormal findings: Secondary | ICD-10-CM

## 2013-09-08 NOTE — Telephone Encounter (Signed)
Message copied by Abner Greenspan on Wed Sep 08, 2013  7:27 PM ------      Message from: Ellamae Sia      Created: Wed Sep 08, 2013  6:23 PM      Regarding: Lab orders for Thursday, 5.7.15       Patient is scheduled for CPX labs, please order future labs, Thanks , Terri       ------

## 2013-09-09 ENCOUNTER — Other Ambulatory Visit (INDEPENDENT_AMBULATORY_CARE_PROVIDER_SITE_OTHER): Payer: Managed Care, Other (non HMO)

## 2013-09-09 DIAGNOSIS — Z0000 Encounter for general adult medical examination without abnormal findings: Secondary | ICD-10-CM

## 2013-09-09 LAB — LIPID PANEL
Cholesterol: 228 mg/dL — ABNORMAL HIGH (ref 0–200)
HDL: 65.9 mg/dL (ref >39.00)
LDL Cholesterol: 147 mg/dL — ABNORMAL HIGH (ref 0–99)
TRIGLYCERIDES: 74 mg/dL (ref 0.0–149.0)
Total CHOL/HDL Ratio: 3
VLDL: 14.8 mg/dL (ref 0.0–40.0)

## 2013-09-09 LAB — COMPREHENSIVE METABOLIC PANEL
ALT: 17 U/L (ref 0–35)
AST: 24 U/L (ref 0–37)
Albumin: 4.2 g/dL (ref 3.5–5.2)
Alkaline Phosphatase: 27 U/L — ABNORMAL LOW (ref 39–117)
BUN: 20 mg/dL (ref 6–23)
CALCIUM: 9.7 mg/dL (ref 8.4–10.5)
CHLORIDE: 106 meq/L (ref 96–112)
CO2: 30 meq/L (ref 19–32)
CREATININE: 1 mg/dL (ref 0.4–1.2)
GFR: 63.79 mL/min (ref >60.00)
Glucose, Bld: 84 mg/dL (ref 70–99)
POTASSIUM: 4.3 meq/L (ref 3.5–5.1)
Sodium: 140 mEq/L (ref 135–145)
Total Bilirubin: 0.8 mg/dL (ref 0.2–1.2)
Total Protein: 6.8 g/dL (ref 6.0–8.3)

## 2013-09-09 LAB — CBC WITH DIFFERENTIAL/PLATELET
BASOS PCT: 0.8 % (ref 0.0–3.0)
Basophils Absolute: 0 10*3/uL (ref 0.0–0.1)
EOS ABS: 0.3 10*3/uL (ref 0.0–0.7)
EOS PCT: 7.4 % — AB (ref 0.0–5.0)
HCT: 39.3 % (ref 36.0–46.0)
HEMOGLOBIN: 13.3 g/dL (ref 12.0–15.0)
Lymphocytes Relative: 38.4 % (ref 12.0–46.0)
Lymphs Abs: 1.4 10*3/uL (ref 0.7–4.0)
MCHC: 33.7 g/dL (ref 30.0–36.0)
MCV: 91.1 fl (ref 78.0–100.0)
MONOS PCT: 7.2 % (ref 3.0–12.0)
Monocytes Absolute: 0.3 10*3/uL (ref 0.1–1.0)
NEUTROS ABS: 1.7 10*3/uL (ref 1.4–7.7)
NEUTROS PCT: 46.2 % (ref 43.0–77.0)
Platelets: 226 10*3/uL (ref 150.0–400.0)
RBC: 4.32 Mil/uL (ref 3.87–5.11)
RDW: 13 % (ref 11.5–15.5)
WBC: 3.7 10*3/uL — AB (ref 4.0–10.5)

## 2013-09-09 LAB — TSH: TSH: 1.27 u[IU]/mL (ref 0.35–4.50)

## 2013-09-12 ENCOUNTER — Telehealth: Payer: Self-pay | Admitting: Family Medicine

## 2013-09-12 DIAGNOSIS — Z0000 Encounter for general adult medical examination without abnormal findings: Secondary | ICD-10-CM

## 2013-09-12 NOTE — Telephone Encounter (Signed)
Message copied by Abner Greenspan on Sun Sep 12, 2013  4:50 PM ------      Message from: Ellamae Sia      Created: Tue Sep 07, 2013  3:49 PM      Regarding: Lab orders for Monday, 5.11.15       Patient is scheduled for CPX labs, please order future labs, Thanks , Terri       ------

## 2013-09-13 ENCOUNTER — Other Ambulatory Visit: Payer: Managed Care, Other (non HMO)

## 2013-09-15 ENCOUNTER — Ambulatory Visit (INDEPENDENT_AMBULATORY_CARE_PROVIDER_SITE_OTHER): Payer: Managed Care, Other (non HMO) | Admitting: Family Medicine

## 2013-09-15 ENCOUNTER — Encounter: Payer: Self-pay | Admitting: Family Medicine

## 2013-09-15 VITALS — BP 120/70 | HR 66 | Temp 97.2°F | Ht 61.0 in | Wt 133.5 lb

## 2013-09-15 DIAGNOSIS — Z0000 Encounter for general adult medical examination without abnormal findings: Secondary | ICD-10-CM

## 2013-09-15 DIAGNOSIS — E785 Hyperlipidemia, unspecified: Secondary | ICD-10-CM

## 2013-09-15 MED ORDER — VALACYCLOVIR HCL 500 MG PO TABS: ORAL_TABLET | ORAL | Status: DC

## 2013-09-15 NOTE — Patient Instructions (Signed)
Call back when you are ready to schedule your colonoscopy  If you are interested in a shingles/zoster vaccine - call your insurance to check on coverage,( you should not get it within 1 month of other vaccines) , then call us for a prescription  for it to take to a pharmacy that gives the shot , or make a nurse visit to get it here depending on your coverage For cholesterol   Avoid red meat/ fried foods/ egg yolks/ fatty breakfast meats/ butter, cheese and high fat dairy/ and shellfish     Fat and Cholesterol Control Diet Fat and cholesterol levels in your blood and organs are influenced by your diet. High levels of fat and cholesterol may lead to diseases of the heart, small and large blood vessels, gallbladder, liver, and pancreas. CONTROLLING FAT AND CHOLESTEROL WITH DIET Although exercise and lifestyle factors are important, your diet is key. That is because certain foods are known to raise cholesterol and others to lower it. The goal is to balance foods for their effect on cholesterol and more importantly, to replace saturated and trans fat with other types of fat, such as monounsaturated fat, polyunsaturated fat, and omega-3 fatty acids. On average, a person should consume no more than 15 to 17 g of saturated fat daily. Saturated and trans fats are considered "bad" fats, and they will raise LDL cholesterol. Saturated fats are primarily found in animal products such as meats, butter, and cream. However, that does not mean you need to give up all your favorite foods. Today, there are good tasting, low-fat, low-cholesterol substitutes for most of the things you like to eat. Choose low-fat or nonfat alternatives. Choose round or loin cuts of red meat. These types of cuts are lowest in fat and cholesterol. Chicken (without the skin), fish, veal, and ground [place] breast are great choices. Eliminate fatty meats, such as hot dogs and salami. Even shellfish have little or no saturated fat. Have a 3 oz (85 g)  portion when you eat lean meat, poultry, or fish. Trans fats are also called "partially hydrogenated oils." They are oils that have been scientifically manipulated so that they are solid at room temperature resulting in a longer shelf life and improved taste and texture of foods in which they are added. Trans fats are found in stick margarine, some tub margarines, cookies, crackers, and baked goods.  When baking and cooking, oils are a great substitute for butter. The monounsaturated oils are especially beneficial since it is believed they lower LDL and raise HDL. The oils you should avoid entirely are saturated tropical oils, such as coconut and palm.  Remember to eat a lot from food groups that are naturally free of saturated and trans fat, including fish, fruit, vegetables, beans, grains (barley, rice, couscous, bulgur wheat), and pasta (without cream sauces).  IDENTIFYING FOODS THAT LOWER FAT AND CHOLESTEROL  Soluble fiber may lower your cholesterol. This type of fiber is found in fruits such as apples, vegetables such as broccoli, potatoes, and carrots, legumes such as beans, peas, and lentils, and grains such as barley. Foods fortified with plant sterols (phytosterol) may also lower cholesterol. You should eat at least 2 g per day of these foods for a cholesterol lowering effect.  Read package labels to identify low-saturated fats, trans fat free, and low-fat foods at the supermarket. Select cheeses that have only 2 to 3 g saturated fat per ounce. Use a heart-healthy tub margarine that is free of trans fats or partially hydrogenated  oil. When buying baked goods (cookies, crackers), avoid partially hydrogenated oils. Breads and muffins should be made from whole grains (whole-wheat or whole oat flour, instead of "flour" or "enriched flour"). Buy non-creamy canned soups with reduced salt and no added fats.  FOOD PREPARATION TECHNIQUES  Never deep-fry. If you must fry, either stir-fry, which uses very  little fat, or use non-stick cooking sprays. When possible, broil, bake, or roast meats, and steam vegetables. Instead of putting butter or margarine on vegetables, use lemon and herbs, applesauce, and cinnamon (for squash and sweet potatoes). Use nonfat yogurt, salsa, and low-fat dressings for salads.  LOW-SATURATED FAT / LOW-FAT FOOD SUBSTITUTES Meats / Saturated Fat (g)  Avoid: Steak, marbled (3 oz/85 g) / 11 g  Choose: Steak, lean (3 oz/85 g) / 4 g  Avoid: Hamburger (3 oz/85 g) / 7 g  Choose: Hamburger, lean (3 oz/85 g) / 5 g  Avoid: Ham (3 oz/85 g) / 6 g  Choose: Ham, lean cut (3 oz/85 g) / 2.4 g  Avoid: Chicken, with skin, dark meat (3 oz/85 g) / 4 g  Choose: Chicken, skin removed, dark meat (3 oz/85 g) / 2 g  Avoid: Chicken, with skin, light meat (3 oz/85 g) / 2.5 g  Choose: Chicken, skin removed, light meat (3 oz/85 g) / 1 g Dairy / Saturated Fat (g)  Avoid: Whole milk (1 cup) / 5 g  Choose: Low-fat milk, 2% (1 cup) / 3 g  Choose: Low-fat milk, 1% (1 cup) / 1.5 g  Choose: Skim milk (1 cup) / 0.3 g  Avoid: Hard cheese (1 oz/28 g) / 6 g  Choose: Skim milk cheese (1 oz/28 g) / 2 to 3 g  Avoid: Cottage cheese, 4% fat (1 cup) / 6.5 g  Choose: Low-fat cottage cheese, 1% fat (1 cup) / 1.5 g  Avoid: Ice cream (1 cup) / 9 g  Choose: Sherbet (1 cup) / 2.5 g  Choose: Nonfat frozen yogurt (1 cup) / 0.3 g  Choose: Frozen fruit bar / trace  Avoid: Whipped cream (1 tbs) / 3.5 g  Choose: Nondairy whipped topping (1 tbs) / 1 g Condiments / Saturated Fat (g)  Avoid: Mayonnaise (1 tbs) / 2 g  Choose: Low-fat mayonnaise (1 tbs) / 1 g  Avoid: Butter (1 tbs) / 7 g  Choose: Extra light margarine (1 tbs) / 1 g  Avoid: Coconut oil (1 tbs) / 11.8 g  Choose: Olive oil (1 tbs) / 1.8 g  Choose: Corn oil (1 tbs) / 1.7 g  Choose: Safflower oil (1 tbs) / 1.2 g  Choose: Sunflower oil (1 tbs) / 1.4 g  Choose: Soybean oil (1 tbs) / 2.4 g  Choose: Canola oil (1 tbs) / 1  g Document Released: 04/22/2005 Document Revised: 08/17/2012 Document Reviewed: 10/11/2010 [employer] Patient Information 2014 [place], [place].

## 2013-09-15 NOTE — Progress Notes (Signed)
Pre visit review using our clinic review tool, if applicable. No additional management support is needed unless otherwise documented below in the visit note. 

## 2013-09-15 NOTE — Progress Notes (Signed)
Subjective:    Patient ID: Danielle Solis, female    DOB: Sep 17, 1955, 58 y.o.   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- and margins were clear - reassuring  Goes to derm every 3 months now - goes to Dr Phillip Heal  She wears her sunscreen -she loves the sun   Wt is stable with bmi of 25  She tries to eat a healthy diet  Some exercise - not as much as she used to due to a very active job   Colon cancer screening - has not had colonosc yet-is interested in one now   Pap 3/03  Hysterectomy in the past  No gyn problems    Mammogram 3/14 - is due / she has a card - she gets the 3D mammogram  Self exam no lumps or changes   Td 6/09  May want a shingles vaccine - will call ins about coverage   Hyperlipidemia Lab Results  Component Value Date   CHOL 228* 09/09/2013   CHOL 242* 04/20/2012   CHOL 193 10/20/2007   Lab Results  Component Value Date   HDL 65.90 09/09/2013   HDL 66.50 04/20/2012   HDL 46.3 10/20/2007   Lab Results  Component Value Date   LDLCALC 147* 09/09/2013   LDLCALC 128* 10/20/2007   Lab Results  Component Value Date   TRIG 74.0 09/09/2013   TRIG 102.0 04/20/2012   TRIG 92 10/20/2007   Lab Results  Component Value Date   CHOLHDL 3 09/09/2013   CHOLHDL 4 04/20/2012   CHOLHDL 4.2 CALC 10/20/2007   Lab Results  Component Value Date   LDLDIRECT 165.3 04/20/2012     Results for orders placed in visit on 09/09/13  CBC WITH DIFFERENTIAL      Result Value Ref Range   WBC 3.7 (*) 4.0 - 10.5 K/uL   RBC 4.32  3.87 - 5.11 Mil/uL   Hemoglobin 13.3  12.0 - 15.0 g/dL   HCT 39.3  36.0 - 46.0 %   MCV 91.1  78.0 - 100.0 fl   MCHC 33.7  30.0 - 36.0 g/dL   RDW 13.0  11.5 - 15.5 %   Platelets 226.0  150.0 - 400.0 K/uL   Neutrophils Relative % 46.2  43.0 - 77.0 %   Lymphocytes Relative 38.4  12.0 - 46.0 %   Monocytes Relative 7.2  3.0 - 12.0 %   Eosinophils  Relative 7.4 (*) 0.0 - 5.0 %   Basophils Relative 0.8  0.0 - 3.0 %   Neutro Abs 1.7  1.4 - 7.7 K/uL   Lymphs Abs 1.4  0.7 - 4.0 K/uL   Monocytes Absolute 0.3  0.1 - 1.0 K/uL   Eosinophils Absolute 0.3  0.0 - 0.7 K/uL   Basophils Absolute 0.0  0.0 - 0.1 K/uL  COMPREHENSIVE METABOLIC PANEL      Result Value Ref Range   Sodium 140  135 - 145 mEq/L   Potassium 4.3  3.5 - 5.1 mEq/L   Chloride 106  96 - 112 mEq/L   CO2 30  19 - 32 mEq/L   Glucose, Bld 84  70 - 99 mg/dL   BUN 20  6 - 23 mg/dL   Creatinine, Ser 1.0  0.4 - 1.2 mg/dL   Total Bilirubin 0.8  0.2 - 1.2 mg/dL   Alkaline Phosphatase 27 (*)  39 - 117 U/L   AST 24  0 - 37 U/L   ALT 17  0 - 35 U/L   Total Protein 6.8  6.0 - 8.3 g/dL   Albumin 4.2  3.5 - 5.2 g/dL   Calcium 9.7  8.4 - 10.5 mg/dL   GFR 63.79  >60.00 mL/min  LIPID PANEL      Result Value Ref Range   Cholesterol 228 (*) 0 - 200 mg/dL   Triglycerides 74.0  0.0 - 149.0 mg/dL   HDL 65.90  >39.00 mg/dL   VLDL 14.8  0.0 - 40.0 mg/dL   LDL Cholesterol 147 (*) 0 - 99 mg/dL   Total CHOL/HDL Ratio 3    TSH      Result Value Ref Range   TSH 1.27  0.35 - 4.50 uIU/mL    Review of Systems Review of Systems  Constitutional: Negative for fever, appetite change, fatigue and unexpected weight change.  Eyes: Negative for pain and visual disturbance.  Respiratory: Negative for cough and shortness of breath.   Cardiovascular: Negative for cp or palpitations    Gastrointestinal: Negative for nausea, diarrhea and constipation.  Genitourinary: Negative for urgency and frequency.  Skin: Negative for pallor or rash   Neurological: Negative for weakness, light-headedness, numbness and headaches.  Hematological: Negative for adenopathy. Does not bruise/bleed easily.  Psychiatric/Behavioral: Negative for dysphoric mood. The patient is not nervous/anxious.         Objective:   Physical Exam  Constitutional: She appears well-developed and well-nourished. No distress.  HENT:  Head:  Normocephalic and atraumatic.  Right Ear: External ear normal.  Left Ear: External ear normal.  Nose: Nose normal.  Mouth/Throat: Oropharynx is clear and moist.  Eyes: Conjunctivae and EOM are normal. Pupils are equal, round, and reactive to light. Right eye exhibits no discharge. Left eye exhibits no discharge. No scleral icterus.  Neck: Normal range of motion. Neck supple. No JVD present. No thyromegaly present.  Cardiovascular: Normal rate, regular rhythm, normal heart sounds and intact distal pulses.  Exam reveals no gallop.   Pulmonary/Chest: Effort normal and breath sounds normal. No respiratory distress. She has no wheezes. She has no rales.  Abdominal: Soft. Bowel sounds are normal. She exhibits no distension and no mass. There is no tenderness.  Genitourinary: No breast swelling, tenderness, discharge or bleeding.  Breast exam: No mass, nodules, thickening, tenderness, bulging, retraction, inflamation, nipple discharge or skin changes noted.  No axillary or clavicular LA.      Musculoskeletal: She exhibits no edema and no tenderness.  Lymphadenopathy:    She has no cervical adenopathy.  Neurological: She is alert. She has normal reflexes. No cranial nerve deficit. She exhibits normal muscle tone. Coordination normal.  Skin: Skin is warm and dry. No rash noted. No erythema. No pallor.  Psychiatric: She has a normal mood and affect.          Assessment & Plan:

## 2013-09-16 NOTE — Assessment & Plan Note (Signed)
Disc goals for lipids and reasons to control them Rev labs with pt Rev low sat fat diet in detail  Pt will work on diet to lower LDL

## 2013-09-16 NOTE — Assessment & Plan Note (Signed)
Reviewed health habits including diet and exercise and skin cancer prevention Reviewed appropriate screening tests for age  Also reviewed health mt list, fam hx and immunization status , as well as social and family history    Labs reviewed today  See HPI Pt will check on coverage of zoster vaccine

## 2013-10-04 ENCOUNTER — Other Ambulatory Visit: Payer: Self-pay | Admitting: Family Medicine

## 2013-10-04 DIAGNOSIS — Z1231 Encounter for screening mammogram for malignant neoplasm of breast: Secondary | ICD-10-CM

## 2013-10-26 ENCOUNTER — Other Ambulatory Visit: Payer: Self-pay | Admitting: Family Medicine

## 2013-10-26 ENCOUNTER — Ambulatory Visit (HOSPITAL_COMMUNITY)
Admission: RE | Admit: 2013-10-26 | Discharge: 2013-10-26 | Disposition: A | Discharge status: Home / Self Care | Payer: Managed Care, Other (non HMO) | Source: Ambulatory Visit | Attending: Family Medicine | Admitting: Family Medicine

## 2013-10-26 DIAGNOSIS — Z1231 Encounter for screening mammogram for malignant neoplasm of breast: Secondary | ICD-10-CM | POA: Insufficient documentation

## 2013-10-26 DIAGNOSIS — R928 Other abnormal and inconclusive findings on diagnostic imaging of breast: Secondary | ICD-10-CM | POA: Insufficient documentation

## 2013-10-27 ENCOUNTER — Other Ambulatory Visit: Payer: Self-pay | Admitting: Family Medicine

## 2013-10-27 DIAGNOSIS — R928 Other abnormal and inconclusive findings on diagnostic imaging of breast: Secondary | ICD-10-CM

## 2013-11-04 ENCOUNTER — Other Ambulatory Visit: Payer: Self-pay

## 2013-11-04 ENCOUNTER — Other Ambulatory Visit: Payer: Self-pay | Admitting: Family Medicine

## 2013-11-04 DIAGNOSIS — R928 Other abnormal and inconclusive findings on diagnostic imaging of breast: Secondary | ICD-10-CM

## 2013-11-08 ENCOUNTER — Ambulatory Visit
Admission: RE | Admit: 2013-11-08 | Discharge: 2013-11-08 | Disposition: A | Discharge status: Home / Self Care | Payer: Managed Care, Other (non HMO) | Source: Ambulatory Visit | Attending: Family Medicine | Admitting: Family Medicine

## 2013-11-08 DIAGNOSIS — R928 Other abnormal and inconclusive findings on diagnostic imaging of breast: Secondary | ICD-10-CM

## 2013-11-08 IMAGING — MG MM DIGITAL DIAGNOSTIC UNILAT*L*
2 series · 2 of 2 positions shown · non-contrast
Comparison: 10/26/2013, 07/20/2012.

CLINICAL DATA: Recall from screening mammography with
tomosynthesis, left breast asymmetry visualized only in the CC view.

EXAM:
DIGITAL DIAGNOSTIC LEFT MAMMOGRAM WITH CAD

[L CC]
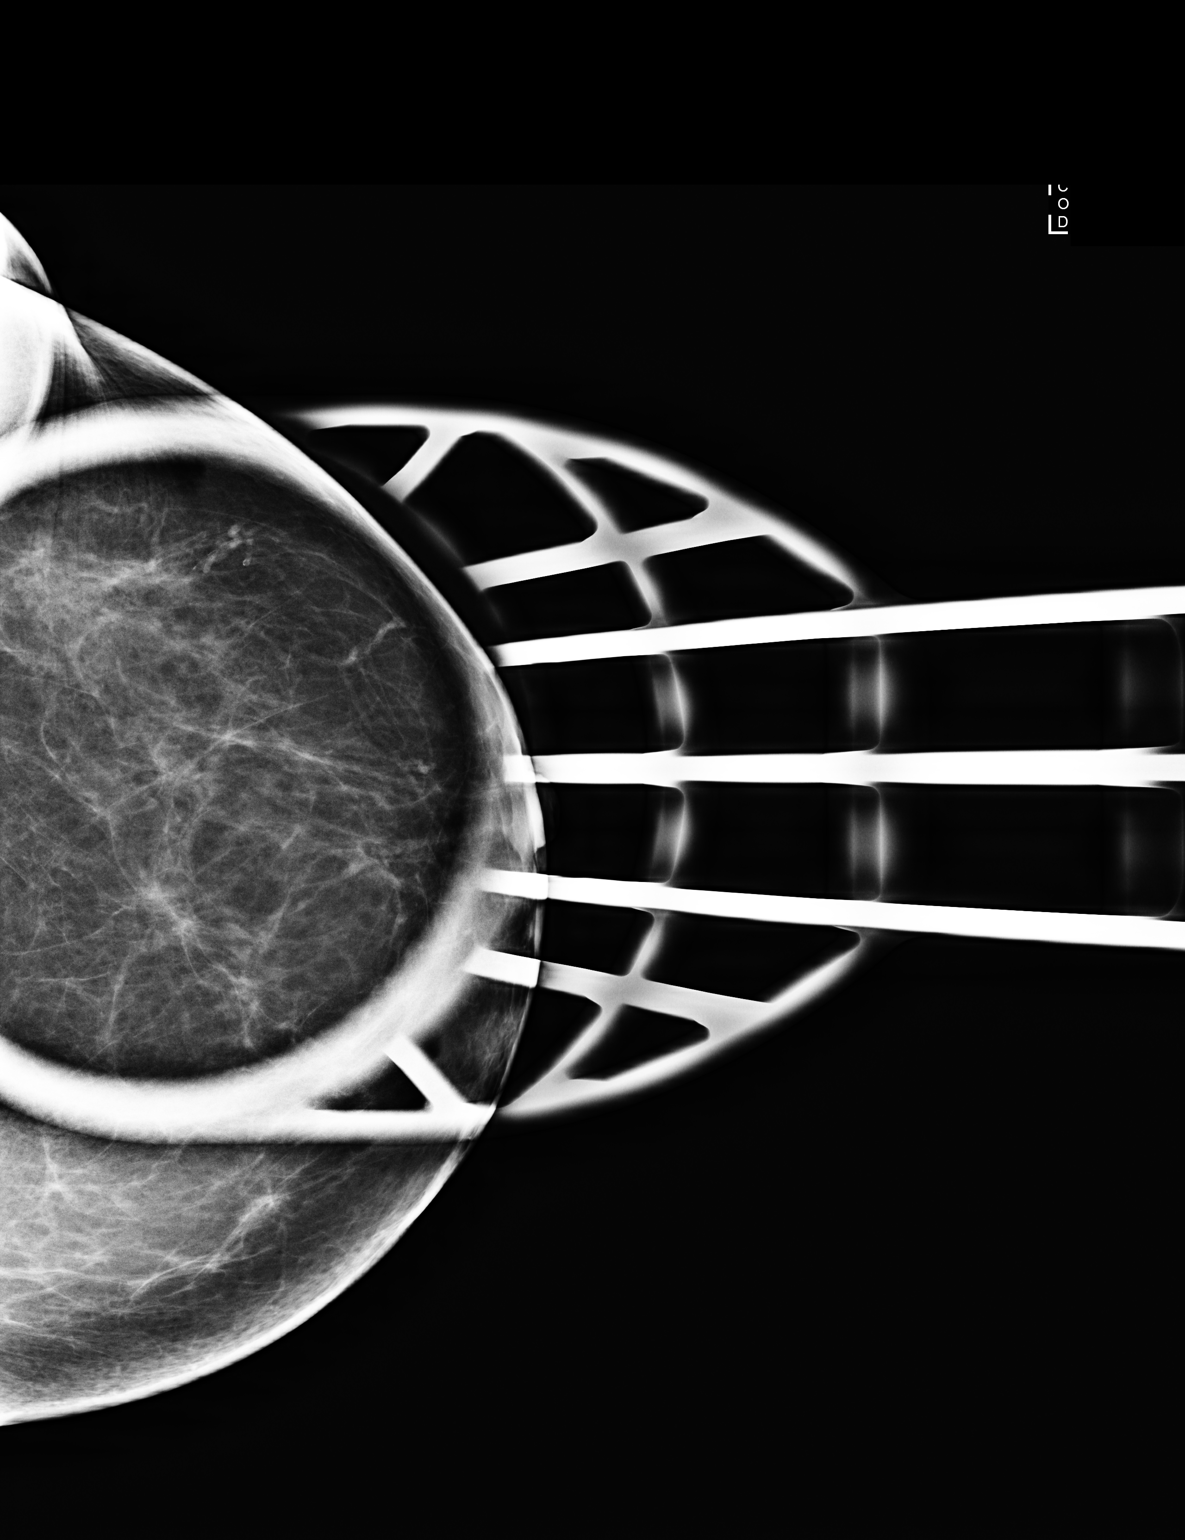

[L ML]
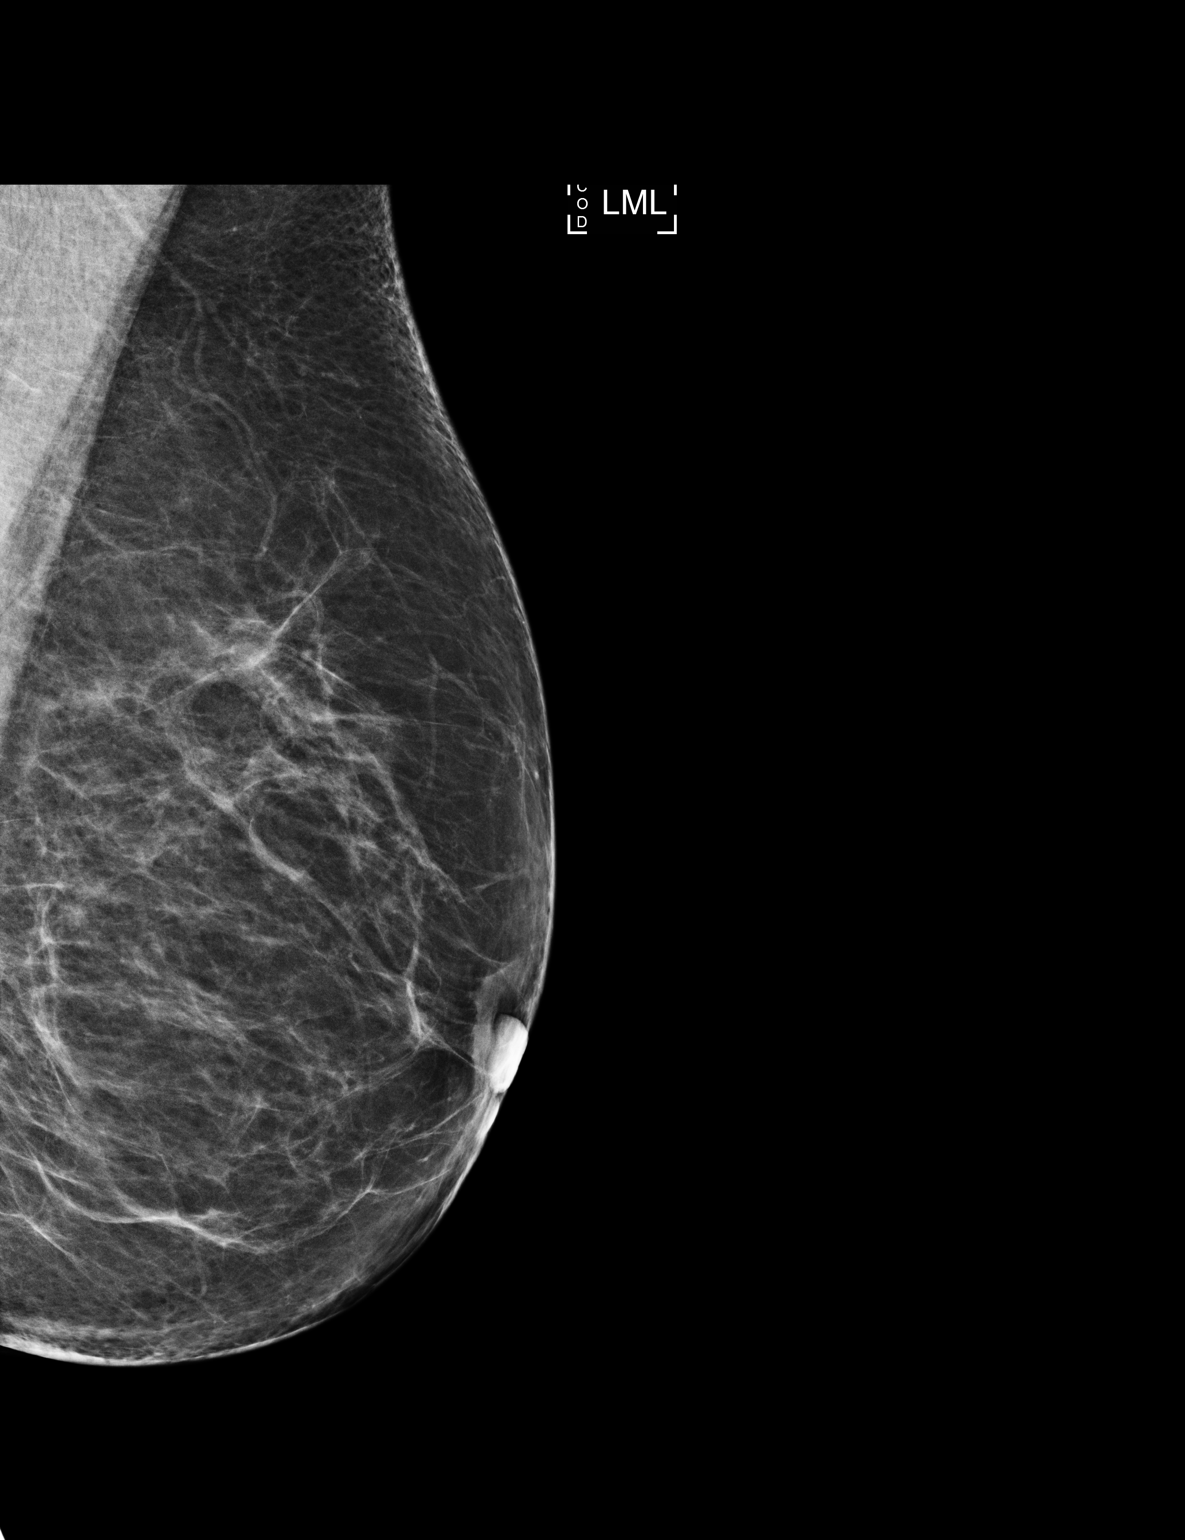

[2 of 2 positions shown; findings below may reference images not displayed]

ACR Breast Density Category b: There are scattered areas of
fibroglandular density.
FINDINGS: Spot compression CC view of the area of concern in the outer left
breast, middle [DATE], and a true lateral view of the left breast were
obtained. No persistent mass, architectural distortion or suspicious
calcification in the area of concern on the screening mammogram.

The true lateral image was processed with CAD.
IMPRESSION: No mammographic evidence of malignancy, left breast. The area of
concern on the screening mammogram is consistent with a summation of
overlapping fibroglandular tissue.

RECOMMENDATION:
Screening mammogram in one year.(Code:UF-B-Y6C)

The importance of self breast examination and an annual clinical
breast examination was discussed with the patient. I have discussed
the findings and recommendations with the patient. Results were also
provided in writing at the conclusion of the visit. If applicable, a
reminder letter will be sent to the patient regarding the next
appointment.

BI-RADS CATEGORY  1: Negative.

## 2013-11-09 ENCOUNTER — Encounter: Payer: Self-pay | Admitting: *Deleted

## 2014-01-18 ENCOUNTER — Ambulatory Visit: Payer: Managed Care, Other (non HMO) | Admitting: Family Medicine

## 2014-11-15 ENCOUNTER — Other Ambulatory Visit: Payer: Self-pay | Admitting: Family Medicine

## 2014-12-26 ENCOUNTER — Encounter: Payer: Self-pay | Admitting: Family Medicine

## 2014-12-26 ENCOUNTER — Ambulatory Visit (INDEPENDENT_AMBULATORY_CARE_PROVIDER_SITE_OTHER): Payer: Managed Care, Other (non HMO) | Admitting: Family Medicine

## 2014-12-26 ENCOUNTER — Ambulatory Visit (INDEPENDENT_AMBULATORY_CARE_PROVIDER_SITE_OTHER)
Admission: RE | Admit: 2014-12-26 | Discharge: 2014-12-26 | Disposition: A | Discharge status: Home / Self Care | Payer: Managed Care, Other (non HMO) | Source: Ambulatory Visit | Attending: Family Medicine | Admitting: Family Medicine

## 2014-12-26 VITALS — BP 118/82 | HR 59 | Temp 98.3°F | Ht 61.0 in | Wt 143.2 lb

## 2014-12-26 DIAGNOSIS — M79644 Pain in right finger(s): Secondary | ICD-10-CM

## 2014-12-26 DIAGNOSIS — G5603 Carpal tunnel syndrome, bilateral upper limbs: Secondary | ICD-10-CM

## 2014-12-26 DIAGNOSIS — G5602 Carpal tunnel syndrome, left upper limb: Secondary | ICD-10-CM

## 2014-12-26 DIAGNOSIS — M79645 Pain in left finger(s): Principal | ICD-10-CM

## 2014-12-26 DIAGNOSIS — G5601 Carpal tunnel syndrome, right upper limb: Secondary | ICD-10-CM

## 2014-12-26 DIAGNOSIS — M2550 Pain in unspecified joint: Secondary | ICD-10-CM | POA: Diagnosis not present

## 2014-12-26 DIAGNOSIS — G5600 Carpal tunnel syndrome, unspecified upper limb: Secondary | ICD-10-CM | POA: Insufficient documentation

## 2014-12-26 LAB — COMPREHENSIVE METABOLIC PANEL
ALT: 25 U/L (ref 0–35)
AST: 23 U/L (ref 0–37)
Albumin: 4.4 g/dL (ref 3.5–5.2)
Alkaline Phosphatase: 37 U/L — ABNORMAL LOW (ref 39–117)
BILIRUBIN TOTAL: 0.5 mg/dL (ref 0.2–1.2)
BUN: 23 mg/dL (ref 6–23)
CALCIUM: 9.8 mg/dL (ref 8.4–10.5)
CO2: 27 mEq/L (ref 19–32)
Chloride: 105 mEq/L (ref 96–112)
Creatinine, Ser: 0.89 mg/dL (ref 0.40–1.20)
GFR: 68.48 mL/min (ref >60.00)
GLUCOSE: 84 mg/dL (ref 70–99)
Potassium: 3.9 mEq/L (ref 3.5–5.1)
SODIUM: 139 meq/L (ref 135–145)
Total Protein: 7.1 g/dL (ref 6.0–8.3)

## 2014-12-26 LAB — CBC WITH DIFFERENTIAL/PLATELET
BASOS PCT: 0.7 % (ref 0.0–3.0)
Basophils Absolute: 0 10*3/uL (ref 0.0–0.1)
EOS ABS: 0.1 10*3/uL (ref 0.0–0.7)
Eosinophils Relative: 1.6 % (ref 0.0–5.0)
HCT: 39.7 % (ref 36.0–46.0)
Hemoglobin: 13.5 g/dL (ref 12.0–15.0)
Lymphocytes Relative: 32.3 % (ref 12.0–46.0)
Lymphs Abs: 1.5 10*3/uL (ref 0.7–4.0)
MCHC: 34 g/dL (ref 30.0–36.0)
MCV: 90.2 fl (ref 78.0–100.0)
MONO ABS: 0.3 10*3/uL (ref 0.1–1.0)
Monocytes Relative: 5.5 % (ref 3.0–12.0)
NEUTROS ABS: 2.7 10*3/uL (ref 1.4–7.7)
Neutrophils Relative %: 59.9 % (ref 43.0–77.0)
Platelets: 267 10*3/uL (ref 150.0–400.0)
RBC: 4.4 Mil/uL (ref 3.87–5.11)
RDW: 12.9 % (ref 11.5–15.5)
WBC: 4.6 10*3/uL (ref 4.0–10.5)

## 2014-12-26 LAB — TSH: TSH: 1.46 u[IU]/mL (ref 0.35–4.50)

## 2014-12-26 LAB — RHEUMATOID FACTOR: Rhuematoid fact SerPl-aCnc: <10 IU/mL (ref <=14)

## 2014-12-26 IMAGING — CR DG FINGER THUMB 2+V*R*
2 series · 2 of 2 positions shown · non-contrast
Comparison: Right hand radiographs 06/05/2010

CLINICAL DATA: Intermittent pain and swelling of both thumbs

EXAM:
RIGHT THUMB 2+V

[view not recorded (1 of 2)]
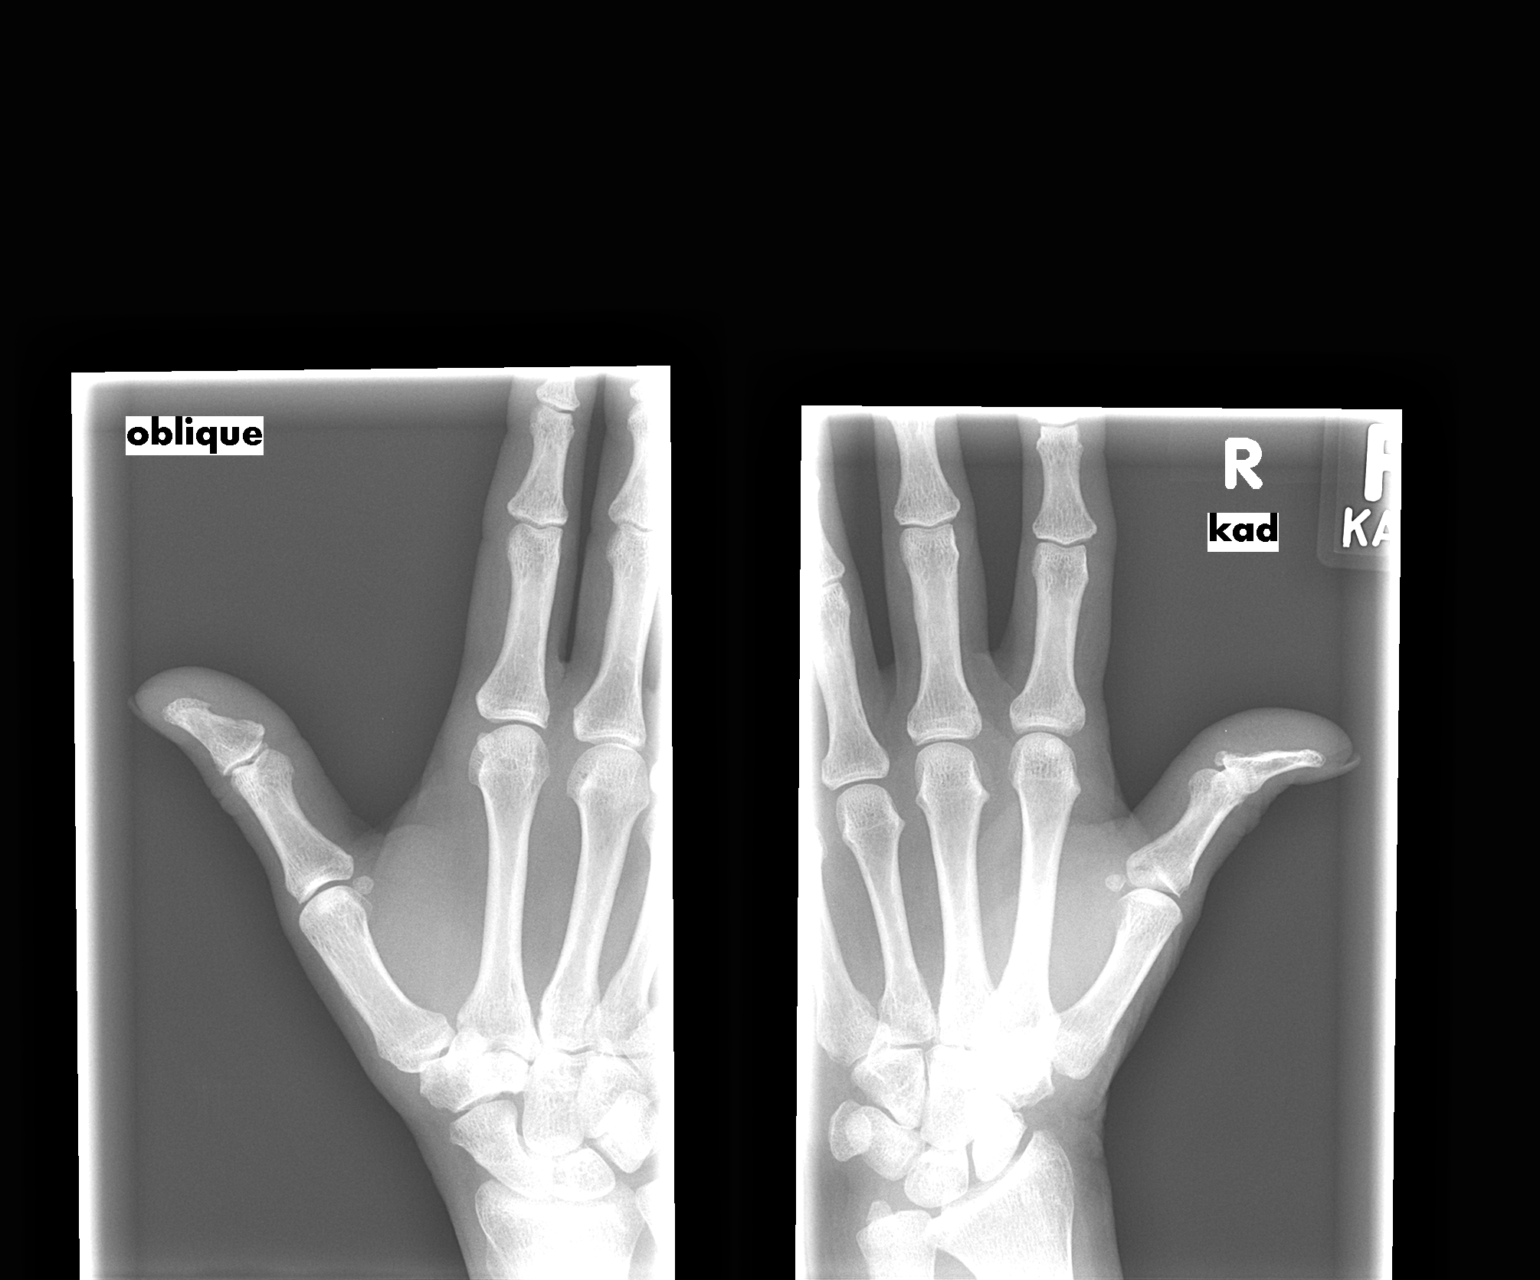

[view not recorded (2 of 2)]
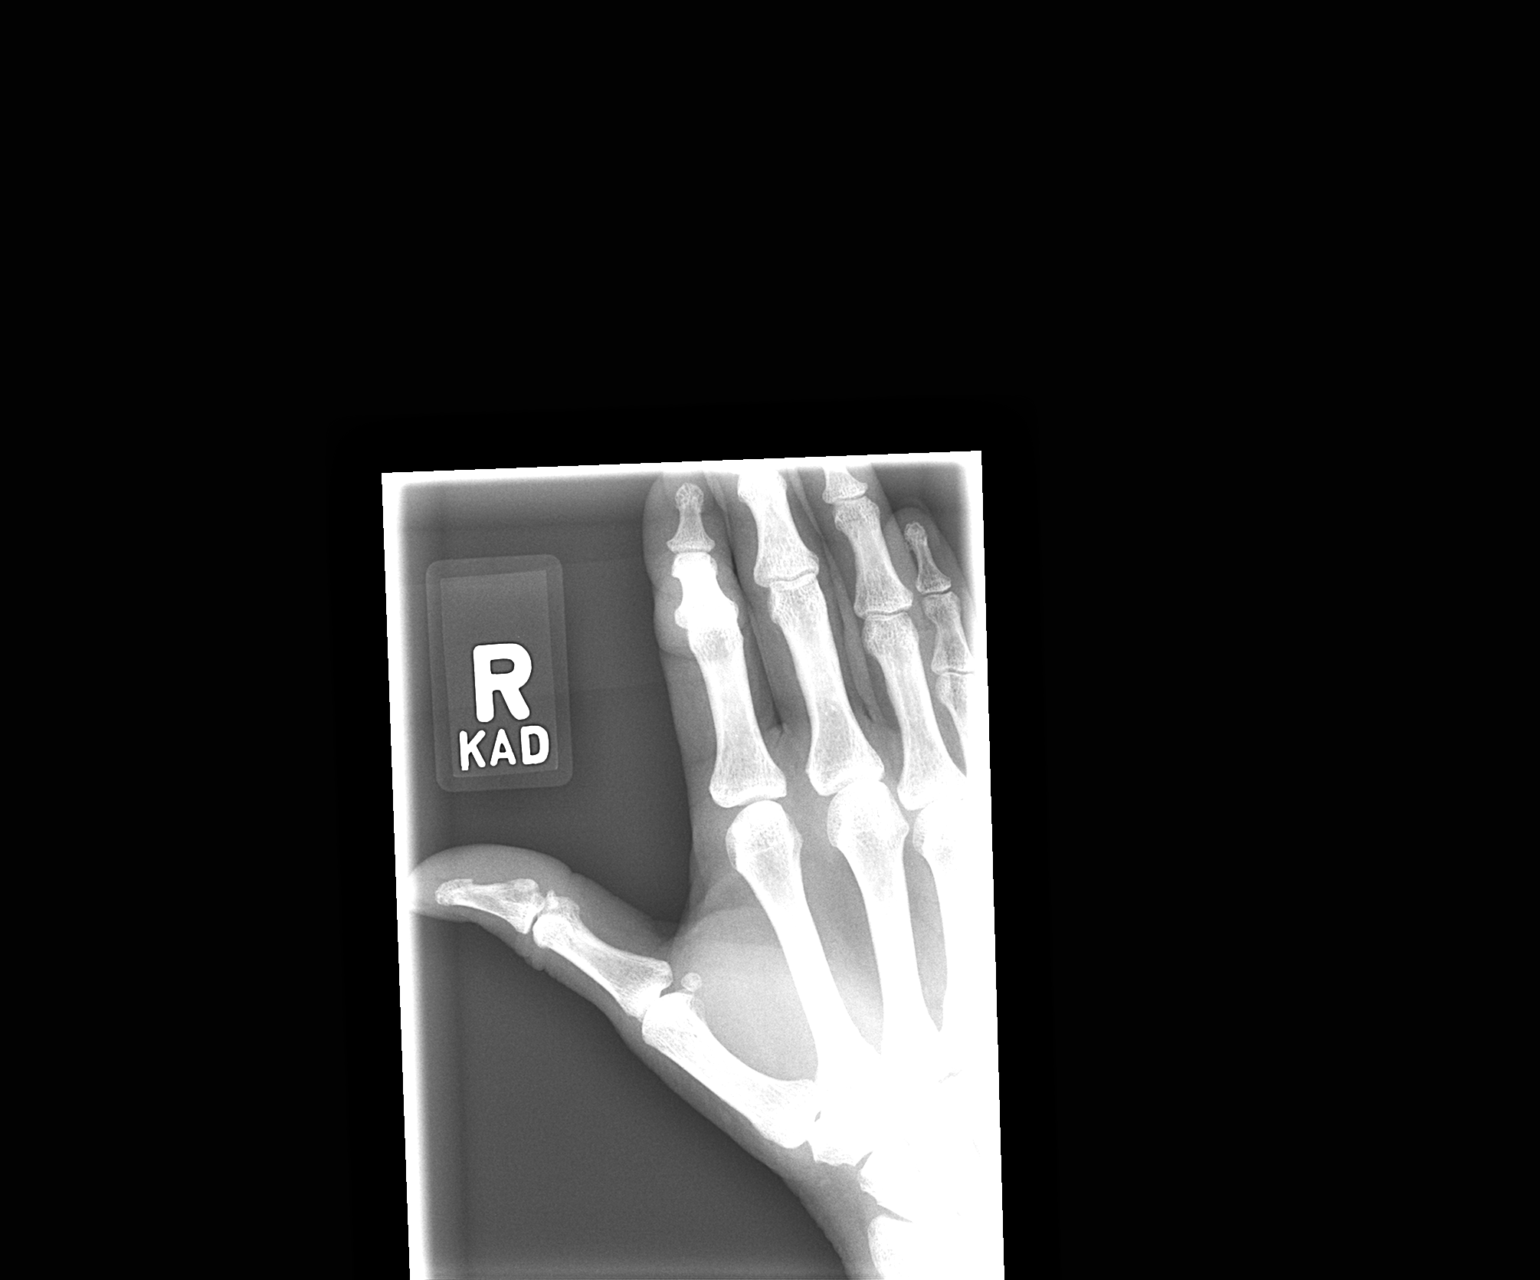

[2 of 2 positions shown; findings below may reference images not displayed]

FINDINGS: There is no evidence of fracture or dislocation. There is no
evidence of arthropathy or other focal bone abnormality. Soft
tissues are unremarkable
IMPRESSION: Negative.

## 2014-12-26 IMAGING — CR DG FINGER THUMB 2+V*L*
2 series · 2 of 2 positions shown · non-contrast
Comparison: None.

CLINICAL DATA: Pain and swelling in both thumbs.

EXAM:
LEFT THUMB 2+V

[view not recorded (1 of 2)]
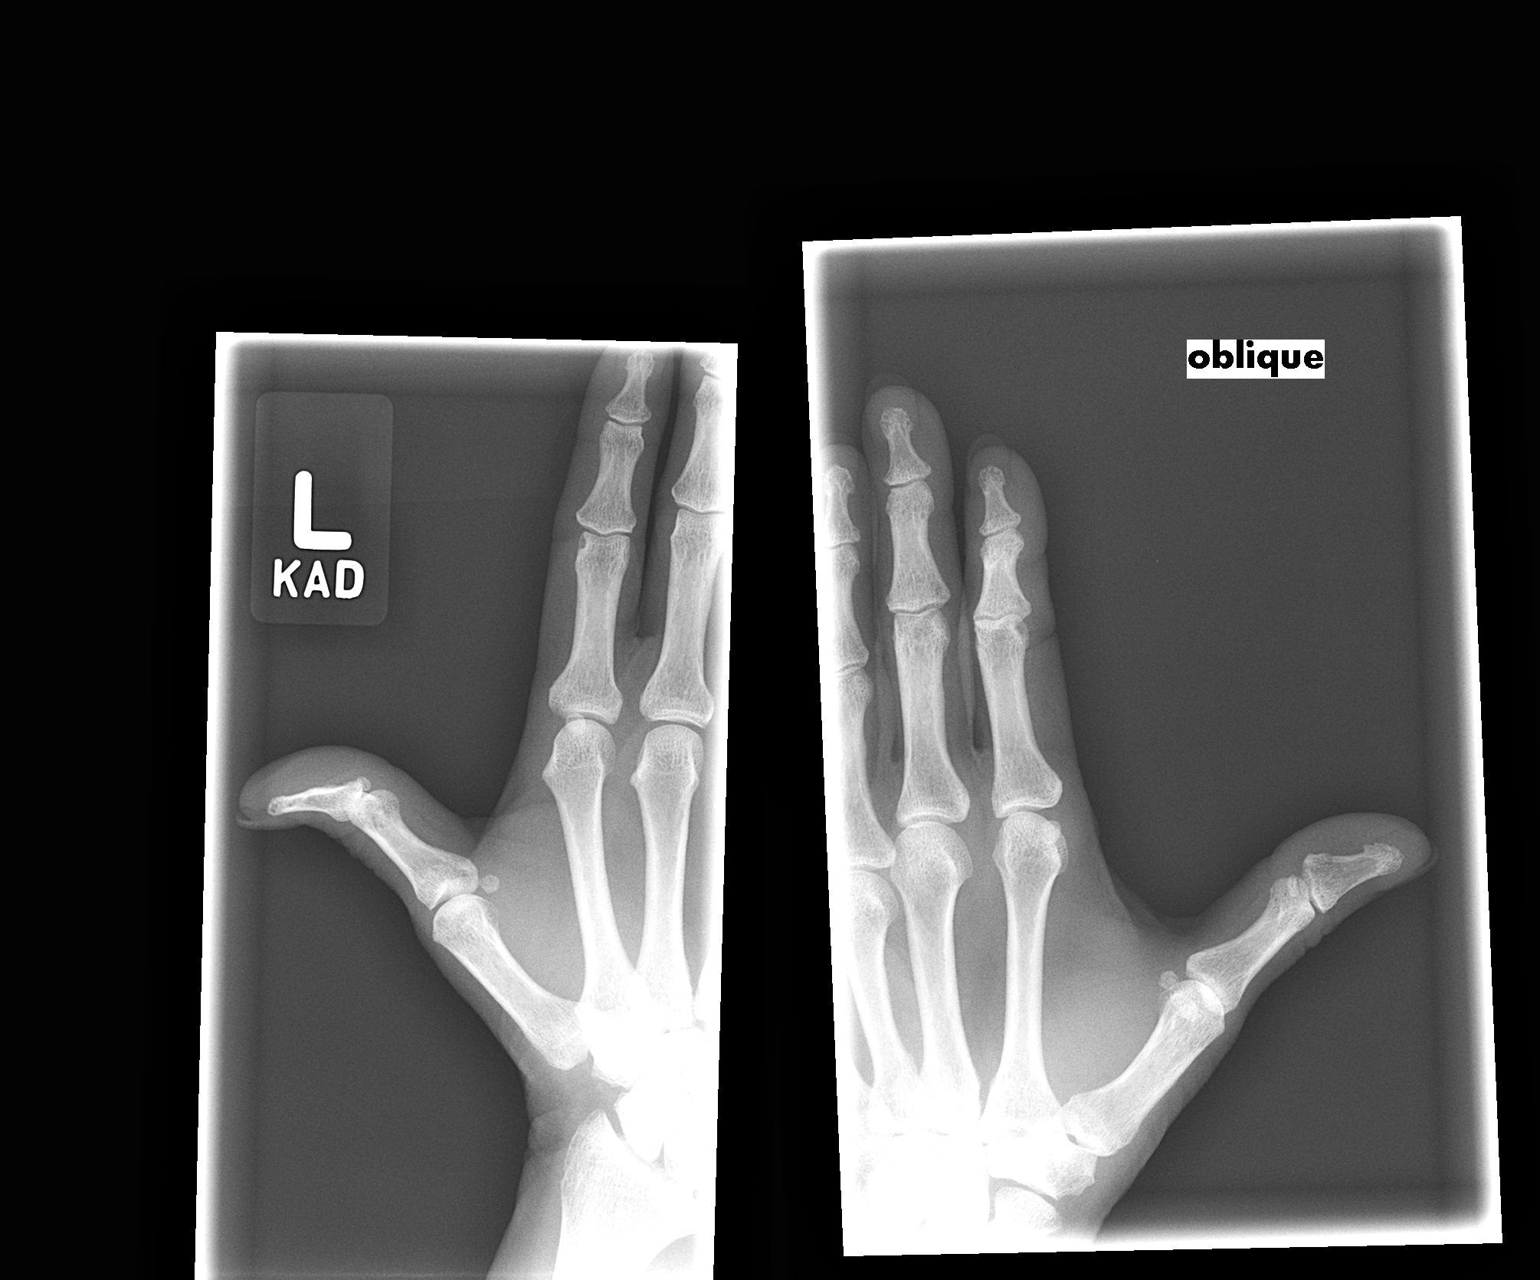

[view not recorded (2 of 2)]
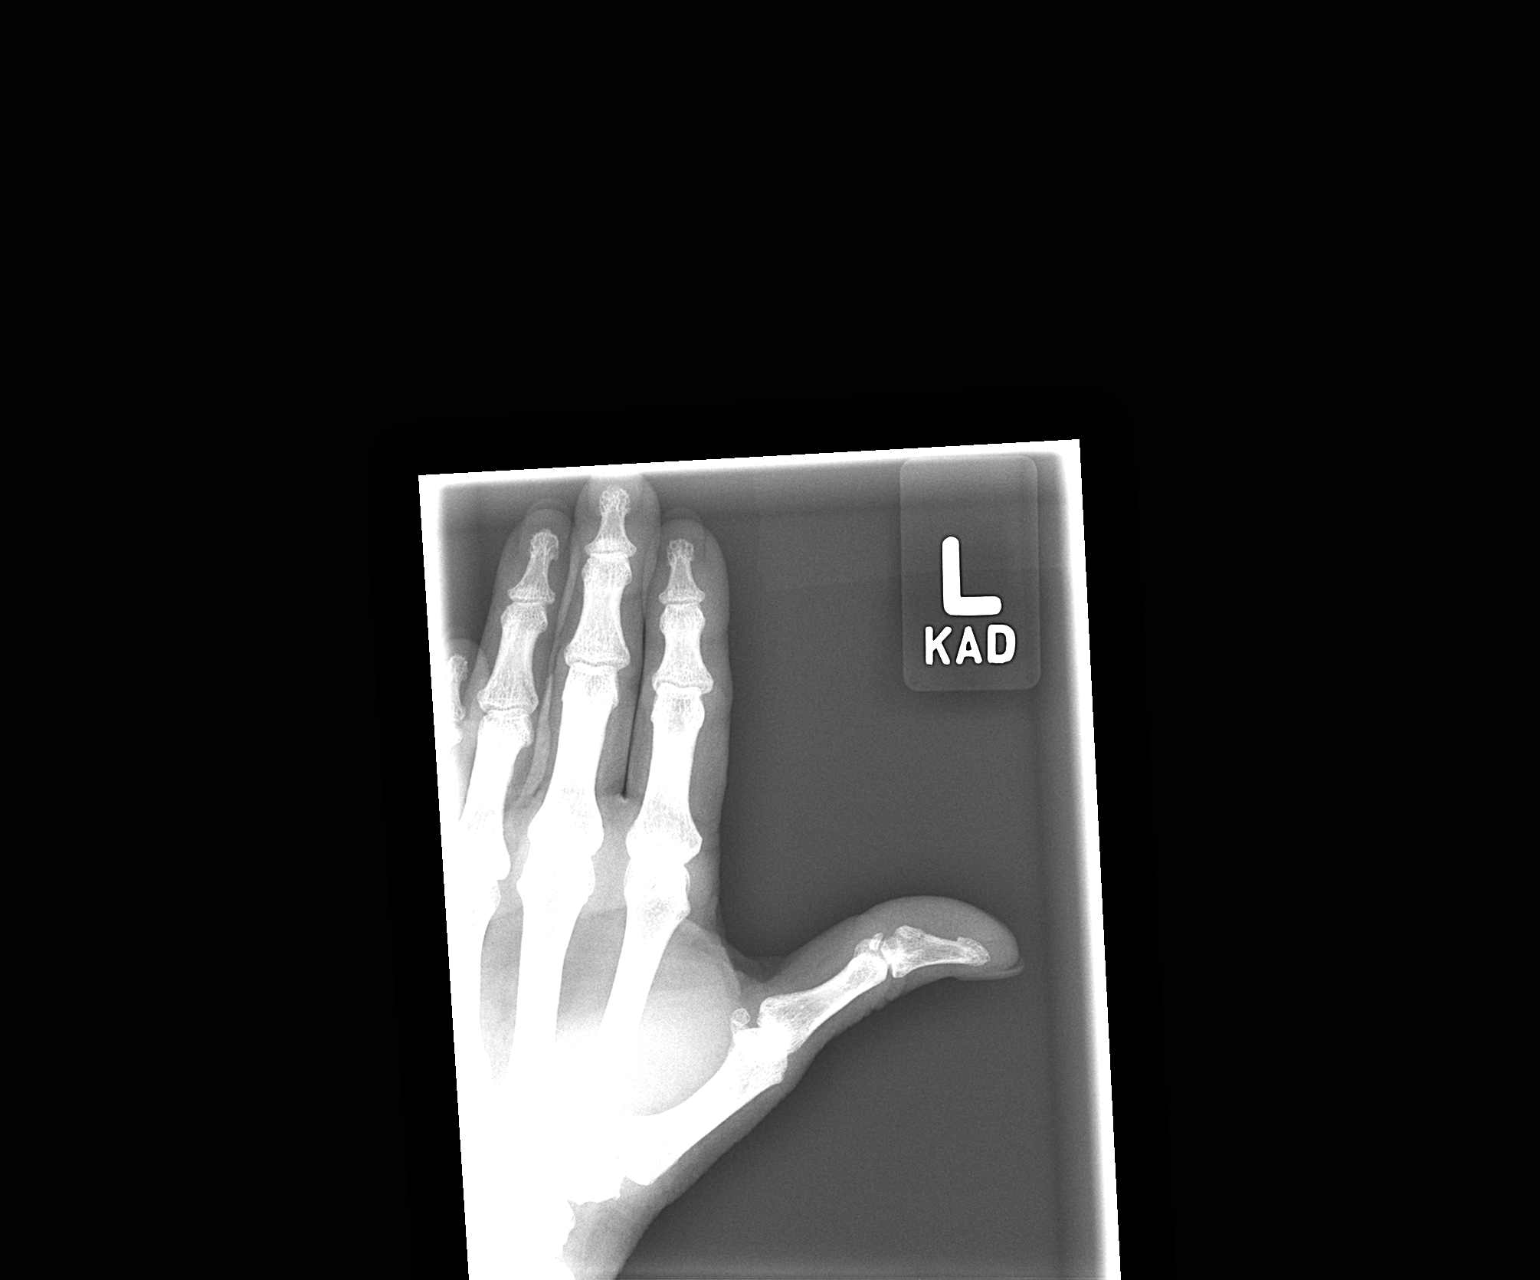

[2 of 2 positions shown; findings below may reference images not displayed]

FINDINGS: No acute bony abnormality. Specifically, no fracture, subluxation,
or dislocation. Soft tissues are intact. Early degenerative changes
at the first carpometacarpal joint with joint space narrowing and
early spurring.
IMPRESSION: No acute bony abnormality.

## 2014-12-26 NOTE — Progress Notes (Signed)
Pre visit review using our clinic review tool, if applicable. No additional management support is needed unless otherwise documented below in the visit note. 

## 2014-12-26 NOTE — Assessment & Plan Note (Signed)
Lab today for autoimmune joint dz Could be overuse/ OA  Xray of thumbs   Acetaminophen prn  Stay active

## 2014-12-26 NOTE — Patient Instructions (Signed)
Use acetaminophen as needed for pain  Stay active  Run hands under warm water in the mornings to get the stiffness out (use lotion afterwards)  Lab today to rule out autoimmune arthritis  Also thumb xrays  I do think you may have some carpal tunnel symptoms also - here is a handout  You can try some over the counter wrist splints at night

## 2014-12-26 NOTE — Progress Notes (Signed)
Subjective:    Patient ID: Danielle Solis, female    DOB: November 28, 1955, 59 y.o.   MRN: [id number]  HPI Here with joint pain and stiffness  in hands today  Also some difficulty feeling things  Sometimes a little tingling in hands at night   This is affecting her fine motor skills - drops things / hurts to grip overall   She thinks there is some swelling in thumb joints (they bother her the most)- proximally   Does not wear rings   Also some other joints - feet hurt and knees hurt - (usually after working 10 hours)  Hips hurt at night - has to work to get comfortable  Thinks that is age related   No other joint swelling No red joints No hot joints    Thinks her GM may have had autoimmune arthritis   Other family members -age related arthritis   Lots of 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 Pain, joint, multiple sites 12/26/2014  . Bilateral thumb pain 12/26/2014  . Carpal tunnel syndrome 12/26/2014  . Other screening mammogram 04/30/2012  . Hyperlipidemia 04/30/2012  . Routine general medical examination at a health care facility 04/18/2012  . Palpable abdominal aorta 12/31/2010  . POSTMENOPAUSAL STATUS 10/20/2007  . Former smoker 10/06/2007  . FEVER BLISTER 03/31/2007  . ALLERGIC RHINITIS 02/12/2007   Past Medical History  Diagnosis Date  . Allergic rhinitis   . Tobacco abuse   . Palpitations    Past Surgical History  Procedure Laterality Date  . Gyn surgery      hysterectomy for endometriosis  . Nephrectomy  1986  . Oophorectomy  1990's  . Tonsillectomy     Social History  Substance Use Topics  . Smoking status: Former Smoker    Types: Cigarettes    Quit date: 04/30/2012  . Smokeless tobacco: None  . Alcohol Use: 0.6 oz/week    1 Standard drinks or equivalent per week     Comment: Occasional   Family  History  Problem Relation Age of Onset  . Hypertension Mother   . Coronary artery disease Mother     PTCA  . Heart disease Mother   . Coronary artery disease Father     stents  . Hypertension Father   . Alcohol abuse Father   . Cancer Father     lung  . Aortic stenosis Son     aortic valve replacement and root replacement for bicuspid aortic valve  . Coronary artery disease Maternal Aunt 40  . Heart disease Maternal Aunt 43    CAD  . Coronary artery disease Maternal Uncle 40  . Heart disease Maternal Uncle 40    CAD   Allergies  Allergen Reactions  . Contrast Media [Iodinated Diagnostic Agents]     1984 - contrast was likely renagrafin.   Current Outpatient Prescriptions on File Prior to Visit  Medication Sig Dispense Refill  . aspirin 81 MG tablet Take 81 mg by mouth daily.    . betamethasone dipropionate (DIPROLENE) 0.05 % cream Apply topically daily. To affected area 30 g 0  . bimatoprost (LATISSE) 0.03 % ophthalmic solution Place into both eyes at bedtime. Place one drop on applicator and apply evenly along the skin of the upper eyelid at base of eyelashes once  daily at bedtime; repeat procedure for second eye (use a clean applicator).    . Clobetasol Propionate (TEMOVATE) 0.05 % external spray Apply topically 2 (two) times daily.    . DiphenhydrAMINE HCl (BENADRYL ALLERGY PO) Take by mouth as directed.      Marland Kitchen ibuprofen (ADVIL,MOTRIN) 200 MG tablet Take 400 mg by mouth every 6 (six) hours as needed.      . loratadine (CLARITIN) 10 MG tablet Take 10 mg by mouth daily.      Marland Kitchen triamcinolone cream (KENALOG) 0.5 % APPLY 1 APPLICATION TOPICALLY DAILY AS NEEDED. 30 g 0  . valACYclovir (VALTREX) 500 MG tablet TAKE 1 TABLET TWICE A DAY FOR 5 DAYS AS NEEDED FOR COLD SORE 10 tablet 2   No current facility-administered medications on file prior to visit.    Review of Systems Review of Systems  Constitutional: Negative for fever, appetite change, fatigue and unexpected weight change.   Eyes: Negative for pain and visual disturbance.  Respiratory: Negative for cough and shortness of breath.   Cardiovascular: Negative for cp or palpitations    Gastrointestinal: Negative for nausea, diarrhea and constipation.  Genitourinary: Negative for urgency and frequency.  Skin: Negative for pallor or rash   MSK pos for joint pain and stiffness/neg for warm or erythematous joints  Neurological: Negative for weakness, light-headedness,  and headaches.  Hematological: Negative for adenopathy. Does not bruise/bleed easily.  Psychiatric/Behavioral: Negative for dysphoric mood. The patient is not nervous/anxious.         Objective:   Physical Exam  Constitutional: She appears well-developed and well-nourished. No distress.  overwt and well appearing   HENT:  Head: Normocephalic and atraumatic.  Mouth/Throat: Oropharynx is clear and moist.  Eyes: Conjunctivae and EOM are normal. Pupils are equal, round, and reactive to light. No scleral icterus.  Neck: Normal range of motion. Neck supple.  Cardiovascular: Normal rate and regular rhythm.   Pulmonary/Chest: Effort normal and breath sounds normal. No respiratory distress. She has no wheezes. She has no rales.  Abdominal: Soft. Bowel sounds are normal. She exhibits no distension. There is no tenderness.  Musculoskeletal: She exhibits tenderness. She exhibits no edema.  Hands: no deformity noted  Tenderness bilateral first MCP joints (no redness/ crepitus or triggering)  Pos tinel and phalen tests bilaterally  No other joint tenderness today  No myofascial tenderness   Lymphadenopathy:    She has no cervical adenopathy.  Neurological: She is alert. She has normal strength and normal reflexes. She displays no atrophy. No cranial nerve deficit or sensory deficit. She exhibits normal muscle tone. Coordination normal.  Pos tinel and phalen tests bilateral wrists causing tingling and pain in 1-4th fingers   Nl grip with some discomfort     Skin: Skin is warm and dry. No rash noted. She is not diaphoretic. No erythema. No pallor.  Psychiatric: She has a normal mood and affect.          Assessment & Plan:   Problem List Items Addressed This Visit    Bilateral thumb pain    Differential incl OA, autoimmune joint dz or carpal tunnel  Has swelling at times Lab today  Xray today        Relevant Orders   DG Finger Thumb Left   DG Finger Thumb Right   Carpal tunnel syndrome    Pos tinel and phalen tests today  This may be causing some of her pain and alt sensation  Will try wrist splints at night Consider  NCV         Pain, joint, multiple sites - Primary    Lab today for autoimmune joint dz Could be overuse/ OA  Xray of thumbs   Acetaminophen prn  Stay active       Relevant Orders   TSH   CBC with Differential/Platelet   Comprehensive metabolic panel   ANA   Rheumatoid factor   POCT SEDIMENTATION RATE

## 2014-12-26 NOTE — Assessment & Plan Note (Signed)
Differential incl OA, autoimmune joint dz or carpal tunnel  Has swelling at times Lab today  Xray today

## 2014-12-26 NOTE — Assessment & Plan Note (Signed)
Pos tinel and phalen tests today  This may be causing some of her pain and alt sensation  Will try wrist splints at night Consider NCV

## 2014-12-27 LAB — ANA: Anti Nuclear Antibody(ANA): NEGATIVE

## 2015-04-24 ENCOUNTER — Other Ambulatory Visit: Payer: Self-pay | Admitting: Family Medicine

## 2015-05-03 ENCOUNTER — Other Ambulatory Visit (INDEPENDENT_AMBULATORY_CARE_PROVIDER_SITE_OTHER): Payer: Managed Care, Other (non HMO)

## 2015-05-03 ENCOUNTER — Telehealth: Payer: Self-pay | Admitting: Family Medicine

## 2015-05-03 DIAGNOSIS — Z0000 Encounter for general adult medical examination without abnormal findings: Secondary | ICD-10-CM

## 2015-05-03 LAB — COMPREHENSIVE METABOLIC PANEL
ALT: 15 U/L (ref 0–35)
AST: 17 U/L (ref 0–37)
Albumin: 4.2 g/dL (ref 3.5–5.2)
Alkaline Phosphatase: 31 U/L — ABNORMAL LOW (ref 39–117)
BUN: 18 mg/dL (ref 6–23)
CALCIUM: 9.5 mg/dL (ref 8.4–10.5)
CHLORIDE: 107 meq/L (ref 96–112)
CO2: 26 mEq/L (ref 19–32)
CREATININE: 0.86 mg/dL (ref 0.40–1.20)
GFR: 71.16 mL/min (ref >60.00)
GLUCOSE: 90 mg/dL (ref 70–99)
Potassium: 4.3 mEq/L (ref 3.5–5.1)
Sodium: 141 mEq/L (ref 135–145)
Total Bilirubin: 0.4 mg/dL (ref 0.2–1.2)
Total Protein: 6.8 g/dL (ref 6.0–8.3)

## 2015-05-03 LAB — LIPID PANEL
CHOLESTEROL: 217 mg/dL — AB (ref 0–200)
HDL: 68.1 mg/dL (ref >39.00)
LDL Cholesterol: 130 mg/dL — ABNORMAL HIGH (ref 0–99)
NonHDL: 148.81
Total CHOL/HDL Ratio: 3
Triglycerides: 92 mg/dL (ref 0.0–149.0)
VLDL: 18.4 mg/dL (ref 0.0–40.0)

## 2015-05-03 LAB — CBC WITH DIFFERENTIAL/PLATELET
BASOS ABS: 0 10*3/uL (ref 0.0–0.1)
BASOS PCT: 0.9 % (ref 0.0–3.0)
EOS PCT: 2.5 % (ref 0.0–5.0)
Eosinophils Absolute: 0.1 10*3/uL (ref 0.0–0.7)
HEMATOCRIT: 40.1 % (ref 36.0–46.0)
Hemoglobin: 13.3 g/dL (ref 12.0–15.0)
LYMPHS PCT: 30.6 % (ref 12.0–46.0)
Lymphs Abs: 1.4 10*3/uL (ref 0.7–4.0)
MCHC: 33.1 g/dL (ref 30.0–36.0)
MCV: 90.5 fl (ref 78.0–100.0)
MONOS PCT: 5.3 % (ref 3.0–12.0)
Monocytes Absolute: 0.3 10*3/uL (ref 0.1–1.0)
NEUTROS ABS: 2.9 10*3/uL (ref 1.4–7.7)
Neutrophils Relative %: 60.7 % (ref 43.0–77.0)
PLATELETS: 245 10*3/uL (ref 150.0–400.0)
RBC: 4.44 Mil/uL (ref 3.87–5.11)
RDW: 13.3 % (ref 11.5–15.5)
WBC: 4.7 10*3/uL (ref 4.0–10.5)

## 2015-05-03 LAB — TSH: TSH: 1.48 u[IU]/mL (ref 0.35–4.50)

## 2015-05-03 NOTE — Telephone Encounter (Signed)
-----   Message from Ellamae Sia sent at 05/02/2015  3:46 PM EST ----- Regarding: Lab orders for Wednesday, 12.28.16 Patient is scheduled for CPX labs, please order future labs, Thanks , Karna Christmas

## 2015-05-10 ENCOUNTER — Encounter: Payer: Self-pay | Admitting: Family Medicine

## 2015-05-10 ENCOUNTER — Ambulatory Visit (INDEPENDENT_AMBULATORY_CARE_PROVIDER_SITE_OTHER): Payer: Managed Care, Other (non HMO) | Admitting: Family Medicine

## 2015-05-10 VITALS — BP 116/68 | HR 65 | Temp 98.1°F | Ht 61.25 in | Wt 141.5 lb

## 2015-05-10 DIAGNOSIS — Z1211 Encounter for screening for malignant neoplasm of colon: Secondary | ICD-10-CM

## 2015-05-10 DIAGNOSIS — Z0000 Encounter for general adult medical examination without abnormal findings: Secondary | ICD-10-CM | POA: Diagnosis not present

## 2015-05-10 DIAGNOSIS — Z1239 Encounter for other screening for malignant neoplasm of breast: Secondary | ICD-10-CM

## 2015-05-10 NOTE — Patient Instructions (Signed)
Please do IFOB stool kit for screening  If you are interested in a shingles/zoster vaccine - call your insurance to check on coverage,( you should not get it within 1 month of other vaccines) , then call us for a prescription  for it to take to a pharmacy that gives the shot , or make a nurse visit to get it here depending on your coverage  Stop at check out for mammogram referral   Try to get 1200-1500 mg of calcium per day with at least 1000 iu of vitamin D - for bone health   For cholesterol (Avoid red meat/ fried foods/ egg yolks/ fatty breakfast meats/ butter, cheese and high fat dairy/ and shellfish )

## 2015-05-10 NOTE — Progress Notes (Signed)
Pre visit review using our clinic review tool, if applicable. No additional management support is needed unless otherwise documented below in the visit note. 

## 2015-05-10 NOTE — Progress Notes (Signed)
Subjective:    Patient ID: Danielle Solis, female    DOB: Sep 25, 1955, 60 y.o.   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   Wt is down 2 lb with bmi of 26 Is working on it  She wants to get back to weight watchers/ being mindful     Screen Hep C/HIV Does not desire screening -not high risk   Colonoscopy -was supposed to have a colonoscopy 09 - but ins did not cover it  She would be interested in one later if her husband's new insurance covers it  Will do ifob kit   Pap 3/03 Had hysterectomy for endometriosis No gyn problems   Zoster vaccine - is interested  Will find out about coverage   Flu vaccine - has not had one - she does not want one   Mm 7/15  Self breast exam- no lumps on self exam  Wants to try [place] - has not been there before   Td 6/09  dexa nl 5/03 No falls or fractures  No ca or D  Does exercise   No menopausal symptoms   Results for orders placed or performed in visit on 05/03/15  CBC with Differential/Platelet  Result Value Ref Range   WBC 4.7 4.0 - 10.5 K/uL   RBC 4.44 3.87 - 5.11 Mil/uL   Hemoglobin 13.3 12.0 - 15.0 g/dL   HCT 40.1 36.0 - 46.0 %   MCV 90.5 78.0 - 100.0 fl   MCHC 33.1 30.0 - 36.0 g/dL   RDW 13.3 11.5 - 15.5 %   Platelets 245.0 150.0 - 400.0 K/uL   Neutrophils Relative % 60.7 43.0 - 77.0 %   Lymphocytes Relative 30.6 12.0 - 46.0 %   Monocytes Relative 5.3 3.0 - 12.0 %   Eosinophils Relative 2.5 0.0 - 5.0 %   Basophils Relative 0.9 0.0 - 3.0 %   Neutro Abs 2.9 1.4 - 7.7 K/uL   Lymphs Abs 1.4 0.7 - 4.0 K/uL   Monocytes Absolute 0.3 0.1 - 1.0 K/uL   Eosinophils Absolute 0.1 0.0 - 0.7 K/uL   Basophils Absolute 0.0 0.0 - 0.1 K/uL  Comprehensive metabolic panel  Result Value Ref Range   Sodium 141 135 - 145 mEq/L   Potassium 4.3 3.5 - 5.1 mEq/L   Chloride 107 96  - 112 mEq/L   CO2 26 19 - 32 mEq/L   Glucose, Bld 90 70 - 99 mg/dL   BUN 18 6 - 23 mg/dL   Creatinine, Ser 0.86 0.40 - 1.20 mg/dL   Total Bilirubin 0.4 0.2 - 1.2 mg/dL   Alkaline Phosphatase 31 (L) 39 - 117 U/L   AST 17 0 - 37 U/L   ALT 15 0 - 35 U/L   Total Protein 6.8 6.0 - 8.3 g/dL   Albumin 4.2 3.5 - 5.2 g/dL   Calcium 9.5 8.4 - 10.5 mg/dL   GFR 71.16 >60.00 mL/min  Lipid panel  Result Value Ref Range   Cholesterol 217 (H) 0 - 200 mg/dL   Triglycerides 92.0 0.0 - 149.0 mg/dL   HDL 68.10 >39.00 mg/dL   VLDL 18.4 0.0 - 40.0 mg/dL   LDL Cholesterol 130 (H) 0 - 99 mg/dL   Total CHOL/HDL Ratio 3    NonHDL 148.81   TSH  Result  Value Ref Range   TSH 1.48 0.35 - 4.50 uIU/mL    Cholesterol - overall decent ratio    Patient Active Problem List   Diagnosis Date Noted  . Colon cancer screening 05/10/2015  . Breast cancer screening 05/10/2015  . Pain, joint, multiple sites 12/26/2014  . Bilateral thumb pain 12/26/2014  . Carpal tunnel syndrome 12/26/2014  . Other screening mammogram 04/30/2012  . Hyperlipidemia 04/30/2012  . Routine general medical examination at a health care facility 04/18/2012  . Palpable abdominal aorta 12/31/2010  . POSTMENOPAUSAL STATUS 10/20/2007  . Former smoker 10/06/2007  . FEVER BLISTER 03/31/2007  . ALLERGIC RHINITIS 02/12/2007   Past Medical History  Diagnosis Date  . Allergic rhinitis   . Tobacco abuse   . Palpitations    Past Surgical History  Procedure Laterality Date  . Gyn surgery      hysterectomy for endometriosis  . Nephrectomy  1986  . Oophorectomy  1990's  . Tonsillectomy     Social History  Substance Use Topics  . Smoking status: Former Smoker    Types: Cigarettes    Quit date: 04/30/2012  . Smokeless tobacco: None  . Alcohol Use: 0.6 oz/week    1 Standard drinks or equivalent per week     Comment: Occasional   Family History  Problem Relation Age of Onset  . Hypertension Mother   . Coronary artery disease Mother      PTCA  . Heart disease Mother   . Coronary artery disease Father     stents  . Hypertension Father   . Alcohol abuse Father   . Cancer Father     lung  . Aortic stenosis Son     aortic valve replacement and root replacement for bicuspid aortic valve  . Coronary artery disease Maternal Aunt 40  . Heart disease Maternal Aunt 20    CAD  . Coronary artery disease Maternal Uncle 40  . Heart disease Maternal Uncle 40    CAD   Allergies  Allergen Reactions  . Contrast Media [Iodinated Diagnostic Agents]     1984 - contrast was likely renagrafin.   Current Outpatient Prescriptions on File Prior to Visit  Medication Sig Dispense Refill  . aspirin 81 MG tablet Take 81 mg by mouth daily.    . betamethasone dipropionate (DIPROLENE) 0.05 % cream Apply topically daily. To affected area 30 g 0  . bimatoprost (LATISSE) 0.03 % ophthalmic solution Place into both eyes at bedtime. Place one drop on applicator and apply evenly along the skin of the upper eyelid at base of eyelashes once daily at bedtime; repeat procedure for second eye (use a clean applicator).    . Clobetasol Propionate (TEMOVATE) 0.05 % external spray Apply topically 2 (two) times daily.    . DiphenhydrAMINE HCl (BENADRYL ALLERGY PO) Take by mouth as directed.      Marland Kitchen ibuprofen (ADVIL,MOTRIN) 200 MG tablet Take 400 mg by mouth every 6 (six) hours as needed.      . loratadine (CLARITIN) 10 MG tablet Take 10 mg by mouth daily.      Marland Kitchen triamcinolone cream (KENALOG) 0.5 % APPLY 1 APPLICATION TOPICALLY DAILY AS NEEDED. 30 g 0  . valACYclovir (VALTREX) 500 MG tablet TAKE 1 TABLET TWICE A DAY FOR 5 DAYS AS NEEDED FOR COLD SORE 10 tablet 2   No current facility-administered medications on file prior to visit.    Review of Systems Review of Systems  Constitutional: Negative for fever, appetite change, fatigue and  unexpected weight change.  Eyes: Negative for pain and visual disturbance.  Respiratory: Negative for cough and shortness of  breath.   Cardiovascular: Negative for cp or palpitations    Gastrointestinal: Negative for nausea, diarrhea and constipation.  Genitourinary: Negative for urgency and frequency.  Skin: Negative for pallor or rash   Neurological: Negative for weakness, light-headedness, numbness and headaches.  Hematological: Negative for adenopathy. Does not bruise/bleed easily.  Psychiatric/Behavioral: Negative for dysphoric mood. The patient is not nervous/anxious.         Objective:   Physical Exam  Constitutional: She appears well-developed and well-nourished. No distress.  overwt and well app   HENT:  Head: Normocephalic and atraumatic.  Right Ear: External ear normal.  Left Ear: External ear normal.  Mouth/Throat: Oropharynx is clear and moist.  Eyes: Conjunctivae and EOM are normal. Pupils are equal, round, and reactive to light. No scleral icterus.  Neck: Normal range of motion. Neck supple. No JVD present. Carotid bruit is not present. No thyromegaly present.  Cardiovascular: Normal rate, regular rhythm, normal heart sounds and intact distal pulses.  Exam reveals no gallop.   Pulmonary/Chest: Effort normal and breath sounds normal. No respiratory distress. She has no wheezes. She exhibits no tenderness.  Abdominal: Soft. Bowel sounds are normal. She exhibits no distension, no abdominal bruit and no mass. There is no tenderness.  Genitourinary: No breast swelling, tenderness, discharge or bleeding.  Breast exam: No mass, nodules, thickening, tenderness, bulging, retraction, inflamation, nipple discharge or skin changes noted.  No axillary or clavicular LA.      Musculoskeletal: Normal range of motion. She exhibits no edema or tenderness.  Lymphadenopathy:    She has no cervical adenopathy.  Neurological: She is alert. She has normal reflexes. No cranial nerve deficit. She exhibits normal muscle tone. Coordination normal.  Skin: Skin is warm and dry. No rash noted. No erythema. No pallor.    Psychiatric: She has a normal mood and affect.          Assessment & Plan:   Problem List Items Addressed This Visit      Other   Breast cancer screening    Ref for screening mammogram at [facility] self exams  Exam done today      Relevant Orders   MM DIGITAL SCREENING BILATERAL   Colon cancer screening - Primary    Given ifob kit  Has not been able to afford colonoscopy Will see if new ins covers-would be interested  Will let us know       Relevant Orders   Fecal occult blood, imunochemical   Routine general medical examination at a health care facility    Reviewed health habits including diet and exercise and skin cancer prevention Reviewed appropriate screening tests for age  Also reviewed health mt list, fam hx and immunization status , as well as social and family history   See HPI Labs reviewed Please do IFOB stool kit for screening  If you are interested in a shingles/zoster vaccine - call your insurance to check on coverage,( you should not get it within 1 month of other vaccines) , then call us for a prescription  for it to take to a pharmacy that gives the shot , or make a nurse visit to get it here depending on your coverage  Stop at check out for mammogram referral   Try to get 1200-1500 mg of calcium per day with at least 1000 iu of vitamin D - for bone health  For cholesterol (Avoid red meat/ fried foods/ egg yolks/ fatty breakfast meats/ butter, cheese and high fat dairy/ and shellfish )

## 2015-05-11 ENCOUNTER — Ambulatory Visit
Admission: RE | Admit: 2015-05-11 | Discharge: 2015-05-11 | Disposition: A | Discharge status: Home / Self Care | Payer: Managed Care, Other (non HMO) | Source: Ambulatory Visit | Attending: Family Medicine | Admitting: Family Medicine

## 2015-05-11 DIAGNOSIS — Z1231 Encounter for screening mammogram for malignant neoplasm of breast: Secondary | ICD-10-CM | POA: Insufficient documentation

## 2015-05-11 DIAGNOSIS — Z1239 Encounter for other screening for malignant neoplasm of breast: Secondary | ICD-10-CM

## 2015-05-11 HISTORY — DX: Malignant (primary) neoplasm, unspecified: C80.1

## 2015-05-11 NOTE — Assessment & Plan Note (Signed)
Ref for screening mammogram at [facility] self exams  Exam done today

## 2015-05-11 NOTE — Assessment & Plan Note (Signed)
Reviewed health habits including diet and exercise and skin cancer prevention Reviewed appropriate screening tests for age  Also reviewed health mt list, fam hx and immunization status , as well as social and family history   See HPI Labs reviewed Please do IFOB stool kit for screening  If you are interested in a shingles/zoster vaccine - call your insurance to check on coverage,( you should not get it within 1 month of other vaccines) , then call us for a prescription  for it to take to a pharmacy that gives the shot , or make a nurse visit to get it here depending on your coverage  Stop at check out for mammogram referral   Try to get 1200-1500 mg of calcium per day with at least 1000 iu of vitamin D - for bone health   For cholesterol (Avoid red meat/ fried foods/ egg yolks/ fatty breakfast meats/ butter, cheese and high fat dairy/ and shellfish )

## 2015-05-11 NOTE — Assessment & Plan Note (Signed)
Given ifob kit  Has not been able to afford colonoscopy Will see if new ins covers-would be interested  Will let us know

## 2015-05-25 ENCOUNTER — Other Ambulatory Visit (INDEPENDENT_AMBULATORY_CARE_PROVIDER_SITE_OTHER): Payer: Managed Care, Other (non HMO)

## 2015-05-25 DIAGNOSIS — Z1211 Encounter for screening for malignant neoplasm of colon: Secondary | ICD-10-CM

## 2015-05-25 LAB — FECAL OCCULT BLOOD, IMMUNOCHEMICAL: Fecal Occult Bld: NEGATIVE

## 2015-06-12 ENCOUNTER — Other Ambulatory Visit: Payer: Managed Care, Other (non HMO)

## 2015-06-19 ENCOUNTER — Encounter: Payer: Managed Care, Other (non HMO) | Admitting: Family Medicine

## 2015-08-24 ENCOUNTER — Other Ambulatory Visit: Payer: Self-pay | Admitting: Family Medicine

## 2015-10-13 ENCOUNTER — Telehealth: Payer: Self-pay

## 2015-10-13 NOTE — Telephone Encounter (Signed)
She is [place] of [place] - advise to look for urgent care to be evaluated

## 2015-10-13 NOTE — Telephone Encounter (Signed)
PLEASE NOTE: All timestamps contained within this report are represented as [place] Standard Time. CONFIDENTIALTY NOTICE: This fax transmission is intended only for the addressee. It contains information that is legally privileged, confidential or otherwise protected from use or disclosure. If you are not the intended recipient, you are strictly prohibited from reviewing, disclosing, copying using or disseminating any of this information or taking any action in reliance on or regarding this information. If you have received this fax in error, please notify us immediately by telephone so that we can arrange for its return to [place]. Phone: [phone], Toll-Free: [phone], Fax: [phone] Page: 1 of 2 Call Id: [id number] [place] Patient Name: Danielle Solis Gender: Female DOB: 02-19-1956 Age: 60 Y 45 D Return Phone Number: [phone] (Primary) Address: City/State/Zip: [place] Client [place] [facility] - Client Client Site [place] [facility], Roque Lias - MD Contact Type Call Who Is Calling Patient / Member / Family / Caregiver Call Type Triage / Clinical Relationship To Patient Self Return Phone Number [phone] (Primary) Chief Complaint Urination Pain Reason for Call Symptomatic / Request for [place] states she is in [place], thinks she has a UTI. Having burning with urination, dull ache in lower back. Appointment Disposition EMR Patient Refused Appointment Info pasted into Epic No PreDisposition Call Doctor Translation No Nurse Assessment Nurse: Lavera Guise, RN, Vaughan Basta Date/Time (Eastern Time): 10/13/2015 1:52:16 PM Confirm and document reason for call. If symptomatic, describe symptoms. You must click the next button to save text entered. ---Caller states she is in [place], thinks she has a UTI.  Having burning with urination, dull ache in lower back.. Sometimes pain is severe and she wants to cry and sometimes it doesn't hurt at all. Had first thing this morning and tries to "go easily." On Wednesday it seemed to be coming on. Yesterday, the pain started to set in. Just got out bed. Not normal during vacation. No fever, but felt nausea this morning. Seemed like there were tiny spots of blood, a little, but not a lot. Urine looked cloudy. Has the patient traveled out of the country within the last 30 days? ---Not Applicable Does the patient have any new or worsening symptoms? ---Yes Will a triage be completed? ---Yes Related visit to physician within the last 2 weeks? ---N/A Does the PT have any chronic conditions? (i.e. diabetes, asthma, etc.) ---No Is this a behavioral health or substance abuse call? ---No Guidelines Guideline Title Affirmed Question Affirmed Notes Nurse Date/Time Eilene Ghazi Time) Urination Pain - Female Side (flank) or lower back pain present Lavera Guise, RNVaughan Basta 10/13/2015 1:58:27 PM PLEASE NOTE: All timestamps contained within this report are represented as [place] Standard Time. CONFIDENTIALTY NOTICE: This fax transmission is intended only for the addressee. It contains information that is legally privileged, confidential or otherwise protected from use or disclosure. If you are not the intended recipient, you are strictly prohibited from reviewing, disclosing, copying using or disseminating any of this information or taking any action in reliance on or regarding this information. If you have received this fax in error, please notify us immediately by telephone so that we can arrange for its return to [place]. Phone: [phone], Toll-Free: [phone], Fax: [phone] Page: 2 of 2 Call Id: [id number] [place]. Time Eilene Ghazi Time) Disposition Final User 10/13/2015 2:00:54 PM See Physician within 4 Hours (or PCP triage) Yes Kluth, RN, Phineas Semen Understands:  Yes Disagree/Comply: Comply Care Advice Given Per Guideline SEE PHYSICIAN WITHIN 4 HOURS (or PCP triage): PAIN MEDICINES: CALL BACK IF: * You become worse. CARE ADVICE given per Urination Pain - Female (Adult) guideline. Referrals GO TO FACILITY UNDECIDED

## 2015-10-16 NOTE — Telephone Encounter (Signed)
Left voicemail requesting pt to call office back 

## 2015-10-17 ENCOUNTER — Telehealth: Payer: Managed Care, Other (non HMO) | Admitting: Family

## 2015-10-17 DIAGNOSIS — N390 Urinary tract infection, site not specified: Secondary | ICD-10-CM

## 2015-10-17 DIAGNOSIS — A499 Bacterial infection, unspecified: Secondary | ICD-10-CM

## 2015-10-17 MED ORDER — SULFAMETHOXAZOLE-TRIMETHOPRIM 800-160 MG PO TABS: 1.0000 | ORAL_TABLET | Freq: Two times a day (BID) | ORAL | Status: AC

## 2015-10-17 NOTE — Telephone Encounter (Signed)
Spoke  with pt, CMA not at desk, read recommendation from PCP, pt is still out of town and is declining Urgent care.  Explained e-visit and Video visit, pt is going to do a [facility] e-Visit.  / lt

## 2015-10-17 NOTE — Progress Notes (Signed)

## 2015-11-21 ENCOUNTER — Other Ambulatory Visit: Payer: Self-pay | Admitting: Family Medicine

## 2016-01-17 ENCOUNTER — Other Ambulatory Visit: Payer: Self-pay

## 2016-01-17 MED ORDER — VALACYCLOVIR HCL 500 MG PO TABS: ORAL_TABLET | ORAL | 0 refills | Status: AC

## 2016-01-17 NOTE — Telephone Encounter (Signed)
Pt has moved to [place] and request refill valacyclovir to [facility], [place]. Pt has cold sore. Last refilled # 10 on 11/21/15. Pt last annual 05/10/15.Please advise/ pt request cb when completed.

## 2016-01-17 NOTE — Telephone Encounter (Signed)
Let her know that the directions for cold sore treatment with valtrex have changed to 2 g twice daily for one day  I have found this to work better as well   So please remove current order   Send in valcyclovir 1 g take 2 pills bid orally for one day for cold sore as needed #4 pills with 3 refills  Good luck with the move

## 2019-07-06 DIAGNOSIS — K2950 Unspecified chronic gastritis without bleeding: Secondary | ICD-10-CM

## 2019-07-06 DIAGNOSIS — K3189 Other diseases of stomach and duodenum: Secondary | ICD-10-CM
